# Patient Record
Sex: Female | Born: 1965
Health system: Southern US, Community
[De-identification: ages and names within clinical notes are randomized; demographics above are authoritative.]

## PROBLEM LIST (undated history)

## (undated) DIAGNOSIS — F32A Depression, unspecified: Secondary | ICD-10-CM

## (undated) DIAGNOSIS — K219 Gastro-esophageal reflux disease without esophagitis: Secondary | ICD-10-CM

## (undated) DIAGNOSIS — F329 Major depressive disorder, single episode, unspecified: Secondary | ICD-10-CM

## (undated) DIAGNOSIS — S83207A Unspecified tear of unspecified meniscus, current injury, left knee, initial encounter: Secondary | ICD-10-CM

## (undated) DIAGNOSIS — M1990 Unspecified osteoarthritis, unspecified site: Secondary | ICD-10-CM

## (undated) DIAGNOSIS — F419 Anxiety disorder, unspecified: Secondary | ICD-10-CM

## (undated) HISTORY — PX: BREAST SURGERY: SHX581

## (undated) HISTORY — PX: REDUCTION MAMMAPLASTY: SUR839

## (undated) HISTORY — PX: COLONOSCOPY W/ POLYPECTOMY: SHX1380

## (undated) HISTORY — PX: RHINOPLASTY: SUR1284

## (undated) HISTORY — PX: KNEE SURGERY: SHX244

## (undated) HISTORY — PX: ABDOMINAL HYSTERECTOMY: SHX81

---

## 1997-02-13 HISTORY — PX: RHINOPLASTY: SUR1284

## 1997-11-19 ENCOUNTER — Ambulatory Visit (HOSPITAL_BASED_OUTPATIENT_CLINIC_OR_DEPARTMENT_OTHER): Admission: RE | Admit: 1997-11-19 | Discharge: 1997-11-19 | Payer: Self-pay | Admitting: Otolaryngology

## 1998-02-13 HISTORY — PX: ABDOMINAL HYSTERECTOMY: SHX81

## 2003-02-14 HISTORY — PX: BREAST REDUCTION SURGERY: SHX8

## 2003-11-24 ENCOUNTER — Emergency Department (HOSPITAL_COMMUNITY): Admission: EM | Admit: 2003-11-24 | Discharge: 2003-11-24 | Payer: Self-pay | Admitting: Family Medicine

## 2003-11-25 ENCOUNTER — Emergency Department (HOSPITAL_COMMUNITY): Admission: EM | Admit: 2003-11-25 | Discharge: 2003-11-25 | Payer: Self-pay | Admitting: Family Medicine

## 2004-02-14 HISTORY — PX: REDUCTION MAMMAPLASTY: SUR839

## 2009-04-21 ENCOUNTER — Encounter: Admission: RE | Admit: 2009-04-21 | Discharge: 2009-06-01 | Payer: Self-pay | Admitting: Neurology

## 2012-06-03 ENCOUNTER — Emergency Department (HOSPITAL_COMMUNITY)
Admission: EM | Admit: 2012-06-03 | Discharge: 2012-06-03 | Disposition: A | Discharge status: Home / Self Care | Payer: 59 | Attending: Emergency Medicine | Admitting: Emergency Medicine

## 2012-06-03 ENCOUNTER — Encounter (HOSPITAL_COMMUNITY): Payer: Self-pay | Admitting: Emergency Medicine

## 2012-06-03 ENCOUNTER — Emergency Department (HOSPITAL_COMMUNITY): Payer: 59

## 2012-06-03 DIAGNOSIS — X500XXA Overexertion from strenuous movement or load, initial encounter: Secondary | ICD-10-CM | POA: Insufficient documentation

## 2012-06-03 DIAGNOSIS — Y92009 Unspecified place in unspecified non-institutional (private) residence as the place of occurrence of the external cause: Secondary | ICD-10-CM | POA: Insufficient documentation

## 2012-06-03 DIAGNOSIS — Z87891 Personal history of nicotine dependence: Secondary | ICD-10-CM | POA: Insufficient documentation

## 2012-06-03 DIAGNOSIS — Y9389 Activity, other specified: Secondary | ICD-10-CM | POA: Insufficient documentation

## 2012-06-03 DIAGNOSIS — W010XXA Fall on same level from slipping, tripping and stumbling without subsequent striking against object, initial encounter: Secondary | ICD-10-CM | POA: Insufficient documentation

## 2012-06-03 DIAGNOSIS — IMO0002 Reserved for concepts with insufficient information to code with codable children: Secondary | ICD-10-CM | POA: Insufficient documentation

## 2012-06-03 DIAGNOSIS — M1711 Unilateral primary osteoarthritis, right knee: Secondary | ICD-10-CM

## 2012-06-03 DIAGNOSIS — Z79899 Other long term (current) drug therapy: Secondary | ICD-10-CM | POA: Insufficient documentation

## 2012-06-03 DIAGNOSIS — S8391XA Sprain of unspecified site of right knee, initial encounter: Secondary | ICD-10-CM

## 2012-06-03 DIAGNOSIS — M1710 Unilateral primary osteoarthritis, unspecified knee: Secondary | ICD-10-CM | POA: Insufficient documentation

## 2012-06-03 MED ORDER — OXYCODONE-ACETAMINOPHEN 5-325 MG PO TABS: ORAL_TABLET | ORAL | Status: DC

## 2012-06-03 MED ORDER — KETOROLAC TROMETHAMINE 30 MG/ML IJ SOLN
15.0000 mg | Freq: Once | INTRAMUSCULAR | Status: AC
  Administered 2012-06-03: 15 mg via INTRAVENOUS
  Filled 2012-06-03: qty 1

## 2012-06-03 NOTE — ED Provider Notes (Signed)
History     CSN: [id number]  Arrival date & time 06/03/12  0128   First MD Initiated Contact with Patient 06/03/12 0240      Chief Complaint  Patient presents with  . Fall  . Knee Pain    (Consider location/radiation/quality/duration/timing/severity/associated sxs/prior treatment) HPI Comments: Patient with prior history of degeneration to her back, possible arthritis reports that she had gotten up from bed placed in her feet and stood up and in a twisting motion developed acute pain to the medial side of her right knee. She rested for a while the pain was not getting better, is unable to bend her knee. Patient had a similar episode several days ago, however symptoms improved after about an hour or so and she was able to then her knee, stand and walk. Patient is a Emergency planning/management officer by trade. She reports that she did have cortisone injections to her left knee in the past. She denies any prior significant injury to her right knee, surgery. She denies any distal numbness, tingling, no skin discoloration. Denies any recent back trauma or injury. EMS had to be called, patient was given IV fentanyl en route with some improvement of her pain.  Patient is a 47 y.o. female presenting with fall and knee pain. The history is provided by the patient.  Fall Pertinent negatives include no fever and no numbness.  Knee Pain Associated symptoms: no back pain and no fever     History reviewed. No pertinent past medical history.  Past Surgical History  Procedure Laterality Date  . Abdominal hysterectomy      Full  . Rhinoplasty  1999  . Breast reduction surgery  2005    History reviewed. No pertinent family history.  History  Substance Use Topics  . Smoking status: Former Smoker    Quit date: 06/04/2002  . Smokeless tobacco: Never Used  . Alcohol Use: Yes     Comment: socially, 2x per wk    OB History   Grav Para Term Preterm Abortions TAB SAB Ect Mult Living                  Review of  Systems  Constitutional: Negative for fever and chills.  Musculoskeletal: Positive for joint swelling and arthralgias. Negative for back pain.  Skin: Negative for color change and wound.  Neurological: Negative for weakness and numbness.    Allergies  Codeine and Sulfa antibiotics  Home Medications   Current Outpatient Rx  Name  Route  Sig  Dispense  Refill  . buPROPion (WELLBUTRIN SR) 150 MG 12 hr tablet   Oral   Take 150 mg by mouth 2 (two) times daily.         . calcium carbonate (TUMS - DOSED IN MG ELEMENTAL CALCIUM) 500 MG chewable tablet   Oral   Chew 1 tablet by mouth daily.         Marland Kitchen Dexlansoprazole (DEXILANT) 30 MG capsule   Oral   Take 30 mg by mouth daily.         Marland Kitchen estrogen-methylTESTOSTERone (ESTRATEST) 1.25-2.5 MG per tablet   Oral   Take 1 tablet by mouth daily.         . LamoTRIgine (LAMICTAL PO)   Oral   Take by mouth 2 (two) times daily.         . methocarbamol (ROBAXIN) 500 MG tablet   Oral   Take 500 mg by mouth 4 (four) times daily.         Marland Kitchen  metoCLOPramide (REGLAN) 5 MG tablet   Oral   Take 5 mg by mouth 2 (two) times daily.         . solifenacin (VESICARE) 5 MG tablet   Oral   Take 10 mg by mouth daily.         Marland Kitchen zolpidem (AMBIEN) 10 MG tablet   Oral   Take 10 mg by mouth at bedtime as needed for sleep.         Marland Kitchen oxyCODONE-acetaminophen (PERCOCET/ROXICET) 5-325 MG per tablet      1-2 tablets po q 6 hours prn moderate to severe pain   20 tablet   0     BP 117/63  Pulse 87  Temp(Src) 99 F (37.2 C) (Oral)  Resp 18  SpO2 96%  Physical Exam  Nursing note and vitals reviewed. Constitutional: She appears well-developed and well-nourished. No distress.  Pulmonary/Chest: Effort normal. No respiratory distress.  Musculoskeletal:       Right knee: She exhibits decreased range of motion and abnormal meniscus. She exhibits no swelling, no effusion, no deformity, no laceration, no erythema and normal patellar mobility.  Tenderness found. Medial joint line and MCL tenderness noted. No patellar tendon tenderness noted.       Legs: Neurological: She is alert.  Skin: Skin is warm. She is not diaphoretic.    ED Course  Procedures (including critical care time)  Labs Reviewed - No data to display Dg Knee Complete 4 Views Right  06/03/2012  *RADIOLOGY REPORT*  Clinical Data: Medial knee pain, recent twisting injury.  RIGHT KNEE - COMPLETE 4+ VIEW  Comparison: None.  Findings: Rounded calcification medial to the medial femoral condyle may reflect sequelae of remote injury.  Rounded calcific density projecting just medial to the medial tibial spine projects anterior to the tibia on the lateral view and may reflect a loose body or sequelae of remote trauma.  Mild lateral compartment and moderate medial and patellofemoral compartment degenerative changes.  Tiny joint effusion.  No acute fracture or dislocation.  IMPRESSION: Tricompartmental degenerative changes.  Suggestion of prior trauma however no acute osseous finding identified.  MRI follow-up recommended if concerned for acute internal derangement is present.   Original Report Authenticated By: Jearld Lesch, M.D.      1. Knee sprain and strain, right, initial encounter   2. Arthritis of knee, right     RA sat is 96% and I interpret to be normal.  MDM  Patient with twisting injury to right knee. No obvious effusion. Extreme pain with flexion greater than about 20. Pain is mostly in the medial line. Possibly some slight laxity but not definite and limited by her pain. Plain film shows no acute fracture, does show tricompartmental degeneration. Patient thinks that her previous knee injections were provided by [facility] orthopedics. Plan is to put her in a knee immobilizer, given crutches, refer back to [facility]oped[facility] for close followup. Ice given as well.          Gavin Pound. Agapita Savarino, MD 06/03/12 1610

## 2012-06-03 NOTE — Discharge Instructions (Signed)
 Knee Sprain A knee sprain is a tear in one of the strong, fibrous tissues that connect the bones (ligaments) in your knee. The severity of the sprain depends on how much of the ligament is torn. The tear can be either partial or complete. CAUSES  Often, sprains are a result of a fall or injury. The force of the impact causes the fibers of your ligament to stretch too much. This excess tension causes the fibers of your ligament to tear. SYMPTOMS  You may have some loss of motion in your knee. Other symptoms include:  Bruising.  Tenderness.  Swelling. DIAGNOSIS  In order to diagnose knee sprain, your caregiver will physically examine your knee to determine how torn the ligament is. Your caregiver may also suggest an X-ray exam of your knee to make sure no bones are broken. TREATMENT  If your ligament is only partially torn, treatment usually involves keeping the knee in a fixed position (immobilization) or bracing your knee for activities that require movement for several weeks. To do this, your caregiver will apply a bandage, cast, or splint to keep your knee from moving or support your knee during movement until it heals. For a partially torn ligament, the healing process usually takes 4 to 6 weeks. If your ligament is completely torn, depending on which ligament it is, you may need surgery to reconnect the ligament to the bone or reconstruct it. After surgery, a cast or splint may be applied and will need to stay on your knee for 4 to 6 weeks while your ligament heals. HOME CARE INSTRUCTIONS  Keep your injured knee elevated to decrease swelling.  To ease pain and swelling, apply ice to your knee twice a day, for 2 to 3 days:  Put ice in a plastic bag.  Place a towel between your skin and the bag.  Leave the ice on for 15 minutes.  Only take over-the-counter or prescription medicine for pain as directed by your caregiver.  Do not leave your knee unprotected until pain and stiffness go  away (usually 4 to 6 weeks).  Do not allow your cast or splint to get wet. If you have been instructed not to remove it, cover your cast or splint with a plastic bag when you shower or bathe. Do not swim.  Your caregiver may suggest exercises for you to do during your recovery to prevent or limit permanent weakness and stiffness. SEEK IMMEDIATE MEDICAL CARE IF:  Your cast or splint becomes damaged.  Your pain becomes worse. MAKE SURE YOU:  Understand these instructions.  Will watch your condition.  Will get help right away if you are not doing well or get worse. Document Released: 01/30/2005 Document Revised: 04/24/2011 Document Reviewed: 01/14/2011 [facility] Patient Information 2013 [place], [place].   Narcotic and benzodiazepine use may cause drowsiness, slowed breathing or dependence.  Please use with caution and do not drive, operate machinery or watch young children alone while taking them.  Taking combinations of these medications or drinking alcohol will potentiate these effects.

## 2012-06-03 NOTE — ED Notes (Signed)
Per EMS pt was getting up to go to the restroom and twisted her knee and she fell on it and has her knee has been locked up ever since  Pt has pain upon movement  IV was started and fentanyl 150 mcg was given prior to arrival

## 2012-06-03 NOTE — ED Notes (Signed)
[id number]<AV> Expected date:06/03/12<BR> Expected time: 1:16 AM<BR> Means of arrival:Ambulance<BR> Comments:<BR> Fall/Knee pain

## 2012-06-06 NOTE — Progress Notes (Signed)
Need orders for 4-30 surgery, pre op is 4-28 200 pm thanks

## 2012-06-07 ENCOUNTER — Other Ambulatory Visit: Payer: Self-pay | Admitting: Orthopedic Surgery

## 2012-06-07 ENCOUNTER — Encounter (HOSPITAL_COMMUNITY): Payer: Self-pay | Admitting: Pharmacy Technician

## 2012-06-07 NOTE — Progress Notes (Signed)
Dr Simonne Come-  NEED PRE OP ORDERS PLEASE-  HAS PST APPT 06/10/12 [facility]

## 2012-06-10 ENCOUNTER — Encounter (HOSPITAL_COMMUNITY): Payer: Self-pay

## 2012-06-10 ENCOUNTER — Encounter (HOSPITAL_COMMUNITY)
Admission: RE | Admit: 2012-06-10 | Discharge: 2012-06-10 | Disposition: A | Discharge status: Home / Self Care | Payer: 59 | Source: Ambulatory Visit | Attending: Orthopedic Surgery | Admitting: Orthopedic Surgery

## 2012-06-10 HISTORY — DX: Gastro-esophageal reflux disease without esophagitis: K21.9

## 2012-06-10 HISTORY — DX: Major depressive disorder, single episode, unspecified: F32.9

## 2012-06-10 HISTORY — DX: Unspecified osteoarthritis, unspecified site: M19.90

## 2012-06-10 HISTORY — DX: Depression, unspecified: F32.A

## 2012-06-10 HISTORY — DX: Anxiety disorder, unspecified: F41.9

## 2012-06-10 LAB — CBC
HCT: 42.2 % (ref 36.0–46.0)
Hemoglobin: 14.3 g/dL (ref 12.0–15.0)
MCH: 30.8 pg (ref 26.0–34.0)
MCHC: 33.9 g/dL (ref 30.0–36.0)
MCV: 90.9 fL (ref 78.0–100.0)
Platelets: 291 10*3/uL (ref 150–400)
RBC: 4.64 MIL/uL (ref 3.87–5.11)
RDW: 12.5 % (ref 11.5–15.5)
WBC: 7.6 10*3/uL (ref 4.0–10.5)

## 2012-06-10 LAB — SURGICAL PCR SCREEN
MRSA, PCR: POSITIVE — AB
Staphylococcus aureus: POSITIVE — AB

## 2012-06-10 NOTE — Patient Instructions (Addendum)
20 Ana Carter  06/10/2012   Your procedure is scheduled on:  4-30 -2014  Report to [facility] at      1200 noon.  Call this number if you have problems the morning of surgery: [phone]  Or Presurgical Testing [phone](Crockett Rallo)      Do not eat food:After Midnight.  May have clear liquids:up to 6 Hours before arrival. Nothing after : 0800 am  Clear liquids include soda, tea, black coffee, apple or grape juice, broth.  Take these medicines the morning of surgery with A SIP OF WATER: Oxycodone. Xanax. Wellbutrin. Estratest. Tramadol. Lamictal. Metoclopramide.   Do not wear jewelry, make-up or nail polish.  Do not wear lotions, powders, or perfumes. You may wear deodorant.  Do not shave 12 hours prior to first CHG shower(legs and under arms).(face and neck okay.)  Do not bring valuables to the hospital.  Contacts, dentures or bridgework,body piercing,  may not be worn into surgery.  Leave suitcase in the car. After surgery it may be brought to your room.  For patients admitted to the hospital, checkout time is 11:00 AM the day of discharge.   Patients discharged the day of surgery will not be allowed to drive home. Must have responsible person with you x 24 hours once discharged.  Name and phone number of your driver: April Baker, [phone]  Special Instructions: CHG(Chlorhedine 4%-"Hibiclens","Betasept","Aplicare") Shower Use Special Wash: see special instructions.(avoid face and genitals)   Please read over the following fact sheets that you were given: MRSA Information.    Failure to follow these instructions may result in Cancellation of your surgery.   Patient signature_______________________________________________________

## 2012-06-11 NOTE — Progress Notes (Signed)
PCR screen -Positive for MRSA-pt. To initiate use of Mupirocin Ointment. Aware will be on Contact Isolation.W. Kennon Portela

## 2012-06-11 NOTE — Pre-Procedure Instructions (Addendum)
06-11-12 4098 -message left to inform pt. PCR screen Positive for MRSA- get Mupirocin Ointment and use as directed- asked to return call to confirm. Note faxed to Dr. Simonne Come office to inform of this. Also, pt. Instructed -Contact Isolation would be required during her stay. W. Jalonda Antigua,RN 06-11-12 0930- Pt. Returned call verified to get Rx. For Mupirocin and use as directed.W. Kennon Portela

## 2012-06-12 ENCOUNTER — Encounter (HOSPITAL_COMMUNITY): Payer: Self-pay | Admitting: Anesthesiology

## 2012-06-12 ENCOUNTER — Encounter (HOSPITAL_COMMUNITY): Payer: Self-pay | Admitting: *Deleted

## 2012-06-12 ENCOUNTER — Ambulatory Visit (HOSPITAL_COMMUNITY): Payer: 59 | Admitting: Anesthesiology

## 2012-06-12 ENCOUNTER — Encounter (HOSPITAL_COMMUNITY)
Admission: RE | Disposition: A | Discharge status: Home / Self Care | Payer: Self-pay | Source: Ambulatory Visit | Attending: Orthopedic Surgery

## 2012-06-12 ENCOUNTER — Ambulatory Visit (HOSPITAL_COMMUNITY)
Admission: RE | Admit: 2012-06-12 | Discharge: 2012-06-12 | Disposition: A | Discharge status: Home / Self Care | Payer: 59 | Source: Ambulatory Visit | Attending: Orthopedic Surgery | Admitting: Orthopedic Surgery

## 2012-06-12 DIAGNOSIS — Z882 Allergy status to sulfonamides status: Secondary | ICD-10-CM | POA: Insufficient documentation

## 2012-06-12 DIAGNOSIS — M659 Unspecified synovitis and tenosynovitis, unspecified site: Secondary | ICD-10-CM | POA: Insufficient documentation

## 2012-06-12 DIAGNOSIS — Z87891 Personal history of nicotine dependence: Secondary | ICD-10-CM | POA: Insufficient documentation

## 2012-06-12 DIAGNOSIS — X58XXXA Exposure to other specified factors, initial encounter: Secondary | ICD-10-CM | POA: Insufficient documentation

## 2012-06-12 DIAGNOSIS — F329 Major depressive disorder, single episode, unspecified: Secondary | ICD-10-CM | POA: Insufficient documentation

## 2012-06-12 DIAGNOSIS — Z79899 Other long term (current) drug therapy: Secondary | ICD-10-CM | POA: Insufficient documentation

## 2012-06-12 DIAGNOSIS — F3289 Other specified depressive episodes: Secondary | ICD-10-CM | POA: Insufficient documentation

## 2012-06-12 DIAGNOSIS — Z885 Allergy status to narcotic agent status: Secondary | ICD-10-CM | POA: Insufficient documentation

## 2012-06-12 DIAGNOSIS — K219 Gastro-esophageal reflux disease without esophagitis: Secondary | ICD-10-CM | POA: Insufficient documentation

## 2012-06-12 DIAGNOSIS — Z9889 Other specified postprocedural states: Secondary | ICD-10-CM

## 2012-06-12 DIAGNOSIS — F411 Generalized anxiety disorder: Secondary | ICD-10-CM | POA: Insufficient documentation

## 2012-06-12 DIAGNOSIS — S83289A Other tear of lateral meniscus, current injury, unspecified knee, initial encounter: Secondary | ICD-10-CM | POA: Insufficient documentation

## 2012-06-12 DIAGNOSIS — M1710 Unilateral primary osteoarthritis, unspecified knee: Secondary | ICD-10-CM | POA: Insufficient documentation

## 2012-06-12 DIAGNOSIS — M47817 Spondylosis without myelopathy or radiculopathy, lumbosacral region: Secondary | ICD-10-CM | POA: Insufficient documentation

## 2012-06-12 HISTORY — PX: STERIOD INJECTION: SHX5046

## 2012-06-12 HISTORY — PX: KNEE ARTHROSCOPY WITH MEDIAL MENISECTOMY: SHX5651

## 2012-06-12 SURGERY — ARTHROSCOPY, KNEE, WITH MEDIAL MENISCECTOMY
Anesthesia: General | Site: Knee | Laterality: Right | Wound class: Clean

## 2012-06-12 MED ORDER — LACTATED RINGERS IV SOLN
INTRAVENOUS | Status: DC | PRN
  Administered 2012-06-12: 13:00:00 via INTRAVENOUS

## 2012-06-12 MED ORDER — KETOROLAC TROMETHAMINE 30 MG/ML IJ SOLN
15.0000 mg | Freq: Once | INTRAMUSCULAR | Status: AC | PRN
  Administered 2012-06-12: 30 mg via INTRAVENOUS
  Filled 2012-06-12: qty 1

## 2012-06-12 MED ORDER — MORPHINE SULFATE 4 MG/ML IJ SOLN
INTRAMUSCULAR | Status: DC | PRN
  Administered 2012-06-12: 4 mg via INTRAMUSCULAR

## 2012-06-12 MED ORDER — BUPIVACAINE-EPINEPHRINE 0.5% -1:200000 IJ SOLN
INTRAMUSCULAR | Status: DC | PRN
  Administered 2012-06-12: 2 mL

## 2012-06-12 MED ORDER — PROMETHAZINE HCL 25 MG/ML IJ SOLN: 6.2500 mg | INTRAMUSCULAR | Status: DC | PRN

## 2012-06-12 MED ORDER — PROPOFOL 10 MG/ML IV BOLUS
INTRAVENOUS | Status: DC | PRN
  Administered 2012-06-12: 200 mg via INTRAVENOUS

## 2012-06-12 MED ORDER — SCOPOLAMINE 1 MG/3DAYS TD PT72
MEDICATED_PATCH | TRANSDERMAL | Status: DC | PRN
  Administered 2012-06-12: 1 via TRANSDERMAL

## 2012-06-12 MED ORDER — METHYLPREDNISOLONE ACETATE 80 MG/ML IJ SUSP
INTRAMUSCULAR | Status: DC | PRN
  Administered 2012-06-12: 80 mg via INTRAMUSCULAR

## 2012-06-12 MED ORDER — LACTATED RINGERS IR SOLN
Status: DC | PRN
  Administered 2012-06-12: 6000 mL

## 2012-06-12 MED ORDER — FENTANYL CITRATE 0.05 MG/ML IJ SOLN
25.0000 ug | INTRAMUSCULAR | Status: DC | PRN
  Administered 2012-06-12 (×2): 50 ug via INTRAVENOUS

## 2012-06-12 MED ORDER — METHYLPREDNISOLONE ACETATE 40 MG/ML IJ SUSP
INTRAMUSCULAR | Status: AC
  Filled 2012-06-12: qty 2

## 2012-06-12 MED ORDER — ONDANSETRON HCL 4 MG/2ML IJ SOLN
INTRAMUSCULAR | Status: DC | PRN
  Administered 2012-06-12: 4 mg via INTRAVENOUS

## 2012-06-12 MED ORDER — MORPHINE SULFATE 4 MG/ML IJ SOLN
INTRAMUSCULAR | Status: AC
  Filled 2012-06-12: qty 1

## 2012-06-12 MED ORDER — FENTANYL CITRATE 0.05 MG/ML IJ SOLN
INTRAMUSCULAR | Status: AC
  Filled 2012-06-12: qty 2

## 2012-06-12 MED ORDER — ACETAMINOPHEN 10 MG/ML IV SOLN
INTRAVENOUS | Status: AC
  Filled 2012-06-12: qty 100

## 2012-06-12 MED ORDER — LACTATED RINGERS IV SOLN
INTRAVENOUS | Status: DC
  Administered 2012-06-12: 16:00:00 via INTRAVENOUS

## 2012-06-12 MED ORDER — SCOPOLAMINE 1 MG/3DAYS TD PT72
MEDICATED_PATCH | TRANSDERMAL | Status: AC
  Filled 2012-06-12: qty 1

## 2012-06-12 MED ORDER — BUPIVACAINE-EPINEPHRINE 0.5% -1:200000 IJ SOLN
INTRAMUSCULAR | Status: DC | PRN
  Administered 2012-06-12: 20 mL

## 2012-06-12 MED ORDER — DEXAMETHASONE SODIUM PHOSPHATE 10 MG/ML IJ SOLN
INTRAMUSCULAR | Status: DC | PRN
  Administered 2012-06-12: 5 mg via INTRAVENOUS

## 2012-06-12 MED ORDER — LIDOCAINE HCL (PF) 2 % IJ SOLN
INTRAMUSCULAR | Status: DC | PRN
  Administered 2012-06-12: 75 mg

## 2012-06-12 MED ORDER — FENTANYL CITRATE 0.05 MG/ML IJ SOLN
INTRAMUSCULAR | Status: DC | PRN
  Administered 2012-06-12 (×2): 50 ug via INTRAVENOUS
  Administered 2012-06-12: 100 ug via INTRAVENOUS
  Administered 2012-06-12: 50 ug via INTRAVENOUS

## 2012-06-12 MED ORDER — ACETAMINOPHEN 10 MG/ML IV SOLN
INTRAVENOUS | Status: DC | PRN
  Administered 2012-06-12: 1000 mg via INTRAVENOUS

## 2012-06-12 MED ORDER — MEPERIDINE HCL 50 MG PO TABS: 50.0000 mg | ORAL_TABLET | ORAL | Status: DC | PRN

## 2012-06-12 MED ORDER — MIDAZOLAM HCL 5 MG/5ML IJ SOLN
INTRAMUSCULAR | Status: DC | PRN
  Administered 2012-06-12: 2 mg via INTRAVENOUS

## 2012-06-12 MED ORDER — BUPIVACAINE-EPINEPHRINE (PF) 0.5% -1:200000 IJ SOLN
INTRAMUSCULAR | Status: AC
  Filled 2012-06-12: qty 10

## 2012-06-12 MED ORDER — POVIDONE-IODINE 7.5 % EX SOLN: Freq: Once | CUTANEOUS | Status: DC

## 2012-06-12 SURGICAL SUPPLY — 31 items
BANDAGE ELASTIC 6 VELCRO ST LF (GAUZE/BANDAGES/DRESSINGS) ×1 IMPLANT
BANDAGE GAUZE ELAST BULKY 4 IN (GAUZE/BANDAGES/DRESSINGS) ×1 IMPLANT
BLADE 4.2CUDA (BLADE) IMPLANT
BLADE CUDA SHAVER 3.5 (BLADE) ×3 IMPLANT
CLOTH BEACON ORANGE TIMEOUT ST (SAFETY) ×3 IMPLANT
CUFF TOURN SGL QUICK 34 (TOURNIQUET CUFF) ×3
CUFF TRNQT CYL 34X4X40X1 (TOURNIQUET CUFF) ×2 IMPLANT
DRSG EMULSION OIL 3X3 NADH (GAUZE/BANDAGES/DRESSINGS) ×3 IMPLANT
DRSG PAD ABDOMINAL 8X10 ST (GAUZE/BANDAGES/DRESSINGS) ×2 IMPLANT
DURAPREP 26ML APPLICATOR (WOUND CARE) ×3 IMPLANT
ELECT REM PT RETURN 9FT ADLT (ELECTROSURGICAL)
ELECTRODE REM PT RTRN 9FT ADLT (ELECTROSURGICAL) ×2 IMPLANT
GLOVE BIO SURGEON STRL SZ7.5 (GLOVE) ×3 IMPLANT
GLOVE BIO SURGEON STRL SZ8 (GLOVE) ×6 IMPLANT
GLOVE ECLIPSE 8.0 STRL XLNG CF (GLOVE) ×9 IMPLANT
GLOVE INDICATOR 8.0 STRL GRN (GLOVE) ×6 IMPLANT
MANIFOLD NEPTUNE II (INSTRUMENTS) ×5 IMPLANT
NDL SAFETY ECLIPSE 18X1.5 (NEEDLE) IMPLANT
NEEDLE HYPO 18GX1.5 SHARP (NEEDLE) ×3
NEEDLE HYPO 22GX1.5 SAFETY (NEEDLE) ×1 IMPLANT
PACK ARTHROSCOPY WL (CUSTOM PROCEDURE TRAY) ×3 IMPLANT
PAD CAST 4YDX4 CTTN HI CHSV (CAST SUPPLIES) ×2 IMPLANT
PADDING CAST COTTON 4X4 STRL (CAST SUPPLIES) ×3
POSITIONER SURGICAL ARM (MISCELLANEOUS) ×2 IMPLANT
SET ARTHROSCOPY TUBING (MISCELLANEOUS) ×3
SET ARTHROSCOPY TUBING LN (MISCELLANEOUS) ×2 IMPLANT
SPONGE GAUZE 4X4 12PLY (GAUZE/BANDAGES/DRESSINGS) ×1 IMPLANT
SUT ETHILON 4 0 PS 2 18 (SUTURE) ×3 IMPLANT
SYR 5ML LL (SYRINGE) ×2 IMPLANT
WAND 90 DEG TURBOVAC W/CORD (SURGICAL WAND) ×2 IMPLANT
WRAP KNEE MAXI GEL POST OP (GAUZE/BANDAGES/DRESSINGS) ×3 IMPLANT

## 2012-06-12 NOTE — H&P (Signed)
Ana Carter is an 47 y.o. female.   Chief Complaint:bilat knee pain with locking rt HPI: MRI rt demonstrates loose bodies  Past Medical History  Diagnosis Date  . Anxiety   . Depression   . GERD (gastroesophageal reflux disease)   . Arthritis     knees and lower back -arthritis    Past Surgical History  Procedure Laterality Date  . Abdominal hysterectomy      Full  . Rhinoplasty  1999  . Breast reduction surgery  2005    also Liposuction of abdomen  . Lasik      bilateral  . Colonoscopy w/ polypectomy      History reviewed. No pertinent family history. Social History:  reports that she quit smoking about 10 years ago. She has never used smokeless tobacco. She reports that  drinks alcohol. She reports that she does not use illicit drugs.  Allergies:  Allergies  Allergen Reactions  . Codeine Nausea And Vomiting  . Sulfa Antibiotics     unknown    Medications Prior to Admission  Medication Sig Dispense Refill  . ALPRAZolam (XANAX) 0.5 MG tablet Take 0.5 mg by mouth daily as needed for sleep or anxiety.      Marland Kitchen buPROPion (WELLBUTRIN XL) 150 MG 24 hr tablet Take 150 mg by mouth 2 (two) times daily.      Marland Kitchen dexlansoprazole (DEXILANT) 60 MG capsule Take 60 mg by mouth daily.      Marland Kitchen estrogen-methylTESTOSTERone (ESTRATEST) 1.25-2.5 MG per tablet Take 1 tablet by mouth daily.      Marland Kitchen lamoTRIgine (LAMICTAL) 200 MG tablet Take 200 mg by mouth 2 (two) times daily.      Marland Kitchen oxybutynin (DITROPAN) 5 MG tablet Take 5 mg by mouth at bedtime.      . polyvinyl alcohol (LIQUIFILM TEARS) 1.4 % ophthalmic solution Place 1 drop into both eyes daily as needed (for dry eyes).      . traMADol (ULTRAM) 50 MG tablet Take 50 mg by mouth every 6 (six) hours as needed for pain.      Marland Kitchen zolpidem (AMBIEN) 10 MG tablet Take 10 mg by mouth at bedtime as needed for sleep.      Marland Kitchen diclofenac sodium (VOLTAREN) 1 % GEL Apply 2 g topically 2 (two) times daily as needed (for pain). Apply to inner part of knee       . fish oil-omega-3 fatty acids 1000 MG capsule Take 1 g by mouth daily.      Marland Kitchen ketorolac (TORADOL) 10 MG tablet Take 10 mg by mouth every 6 (six) hours as needed for pain.      Marland Kitchen metoCLOPramide (REGLAN) 10 MG tablet Take 10 mg by mouth 2 (two) times daily.      Marland Kitchen oxyCODONE-acetaminophen (PERCOCET/ROXICET) 5-325 MG per tablet 1-2 tablets po q 6 hours prn moderate to severe pain  20 tablet  0  . sucralfate (CARAFATE) 1 G tablet Take 0.5 g by mouth 2 (two) times daily.      . vitamin B-12 (CYANOCOBALAMIN) 1000 MCG tablet Take 1,000 mcg by mouth daily.        Results for orders placed during the hospital encounter of 06/10/12 (from the past 48 hour(s))  SURGICAL PCR SCREEN     Status: Abnormal   Collection Time    06/10/12  3:03 PM      Result Value Range   MRSA, PCR POSITIVE (*) NEGATIVE   Staphylococcus aureus POSITIVE (*) NEGATIVE   Comment:  The Xpert SA Assay (FDA     approved for NASAL specimens     in patients over 46 years of age),     is one component of     a comprehensive surveillance     program.  Test performance has     been validated by [employer] for patients greater     than or equal to 56 year old.     It is not intended     to diagnose infection nor to     guide or monitor treatment.  CBC     Status: None   Collection Time    06/10/12  3:25 PM      Result Value Range   WBC 7.6  4.0 - 10.5 K/uL   RBC 4.64  3.87 - 5.11 MIL/uL   Hemoglobin 14.3  12.0 - 15.0 g/dL   HCT 84.6  96.2 - 95.2 %   MCV 90.9  78.0 - 100.0 fL   MCH 30.8  26.0 - 34.0 pg   MCHC 33.9  30.0 - 36.0 g/dL   RDW 84.1  32.4 - 40.1 %   Platelets 291  150 - 400 K/uL   No results found.  ROS  Blood pressure 125/70, pulse 74, temperature 98.8 F (37.1 C), temperature source Oral, resp. rate 16, SpO2 100.00%. Physical Exam  Constitutional: She is oriented to person, place, and time. She appears well-developed and well-nourished.  HENT:  Head: Normocephalic and atraumatic.   Right Ear: External ear normal.  Left Ear: External ear normal.  Nose: Nose normal.  Mouth/Throat: Oropharynx is clear and moist.  Eyes: Conjunctivae and EOM are normal. Pupils are equal, round, and reactive to light.  Neck: Normal range of motion. Neck supple.  Cardiovascular: Normal rate, regular rhythm, normal heart sounds and intact distal pulses.   Respiratory: Effort normal and breath sounds normal.  GI: Soft. Bowel sounds are normal.  Musculoskeletal:  Right knee--painful rom and tenderness mainly medial;also some pain in lt knee  Neurological: She is alert and oriented to person, place, and time. She has normal reflexes.  Skin: Skin is warm and dry.  Psychiatric: She has a normal mood and affect. Her behavior is normal. Judgment and thought content normal.     Assessment/Plan Pain and locking rt knee Rt knee arthroscopy with removal loose bodies;steroid injection lt knee  Hendrix Yurkovich P 06/12/2012, 2:08 PM

## 2012-06-12 NOTE — Anesthesia Postprocedure Evaluation (Signed)
  Anesthesia Post-op Note  Patient: Ana Carter  Procedure(s) Performed: Procedure(s) (LRB): RIGHT KNEE ARTHROSCOPY WITH PARTIAL MEDIAL MENISCECTOMY And chondroplasty (Right) CORTISONE INJECTION TO LEFT KNEE (Left)  Patient Location: PACU  Anesthesia Type: General  Level of Consciousness: awake and alert   Airway and Oxygen Therapy: Patient Spontanous Breathing  Post-op Pain: mild  Post-op Assessment: Post-op Vital signs reviewed, Patient's Cardiovascular Status Stable, Respiratory Function Stable, Patent Airway and No signs of Nausea or vomiting  Last Vitals:  Filed Vitals:   06/12/12 1555  BP:   Pulse: 77  Temp:   Resp: 12    Post-op Vital Signs: stable   Complications: No apparent anesthesia complications

## 2012-06-12 NOTE — Anesthesia Preprocedure Evaluation (Signed)
Anesthesia Evaluation  Patient identified by MRN, date of birth, ID band Patient awake    Reviewed: Allergy & Precautions, H&P , NPO status , Patient's Chart, lab work & pertinent test results  Airway Mallampati: II TM Distance: >3 FB Neck ROM: Full    Dental no notable dental hx.    Pulmonary neg pulmonary ROS,  breath sounds clear to auscultation  Pulmonary exam normal       Cardiovascular negative cardio ROS  Rhythm:Regular Rate:Normal     Neuro/Psych Anxiety negative neurological ROS     GI/Hepatic Neg liver ROS, GERD-  Medicated,  Endo/Other  negative endocrine ROS  Renal/GU negative Renal ROS  negative genitourinary   Musculoskeletal negative musculoskeletal ROS (+)   Abdominal   Peds negative pediatric ROS (+)  Hematology negative hematology ROS (+)   Anesthesia Other Findings   Reproductive/Obstetrics negative OB ROS                           Anesthesia Physical Anesthesia Plan  ASA: II  Anesthesia Plan: General   Post-op Pain Management:    Induction: Intravenous  Airway Management Planned: LMA  Additional Equipment:   Intra-op Plan:   Post-operative Plan:   Informed Consent: I have reviewed the patients History and Physical, chart, labs and discussed the procedure including the risks, benefits and alternatives for the proposed anesthesia with the patient or authorized representative who has indicated his/her understanding and acceptance.   Dental advisory given  Plan Discussed with: CRNA and Surgeon  Anesthesia Plan Comments:         Anesthesia Quick Evaluation

## 2012-06-12 NOTE — Transfer of Care (Signed)
Immediate Anesthesia Transfer of Care Note  Patient: Ana Carter  Procedure(s) Performed: Procedure(s): RIGHT KNEE ARTHROSCOPY WITH PARTIAL MEDIAL MENISCECTOMY And chondroplasty (Right) CORTISONE INJECTION TO LEFT KNEE (Left)  Patient Location: PACU  Anesthesia Type:General  Level of Consciousness: awake, alert , oriented and patient cooperative  Airway & Oxygen Therapy: Patient Spontanous Breathing and Patient connected to face mask oxygen  Post-op Assessment: Report given to PACU RN and Post -op Vital signs reviewed and stable  Post vital signs: Reviewed and stable  Complications: No apparent anesthesia complications

## 2012-06-12 NOTE — Preoperative (Signed)
Beta Blockers   Reason not to administer Beta Blockers:Not Applicable, not on home BB 

## 2012-06-13 ENCOUNTER — Encounter (HOSPITAL_COMMUNITY): Payer: Self-pay | Admitting: Orthopedic Surgery

## 2012-06-13 NOTE — Op Note (Addendum)
NAMEELYANAH, FARINO NO.:  [id number]  MEDICAL RECORD NO.:  [id number]  LOCATION:  [facility]                         FACILITY:  [facility]  PHYSICIAN:  Marlowe Kays, M.D.  DATE OF BIRTH:  [birth year]  DATE OF PROCEDURE:  06/12/2012 DATE OF DISCHARGE:  06/12/2012                              OPERATIVE REPORT   PREOPERATIVE DIAGNOSES: 1. Painful right knee with locking. 2. MRI evidence of loose fragments in the knee.  POSTOPERATIVE DIAGNOSES: 1. Painful right knee with locking. 2. Torn anterior horn of medial meniscus. 3. Painful left knee.  OPERATIONS: 1. Right knee arthroscopy with partial medial meniscectomy,     debridement of the medial femoral condyle, and removal of suspected     loose bodies. 2. Intra-articular injection, left knee, with steroid and Marcaine.  SURGEON:  Marlowe Kays, MD  ASSISTANT:  Nurse.  ANESTHESIA:  General.  PATHOLOGY AND JUSTIFICATION FOR PROCEDURE:  Roughly a week ago, she developed painful right knee and several days later, the knee began to lock on her.  She had come down on the knee, but there was no definite injury.  I saw her in the office.  She was in severe pain with the knee locked.  We sent for an MRI which demonstrated damage to the medial femoral condyle and also what appeared to be some loose bodies in the knee, but no definite meniscus tear.  She also was having some pain in the left knee and since she was here for arthroscopy at this time of the right knee, requested intraarticular steroid injection in her left knee as well.  DESCRIPTION OF PROCEDURE:  Satisfactory general anesthesia, Ace wrap, and knee support to left lower extremity, the right lower extremity tourniquet was applied with leg Esmarch out non-sterilely.  Thigh stabilizer applied.  Right leg was prepped with DuraPrep, stabilizer to ankle, and draped in a sterile field.  Time-out performed.  First, an anterolateral portal and the medial  compartment of the knee joint was evaluated.  On entering the joint, she had blood in the joint with synovitis anteromedially and deformity of the medial meniscus.  I shaved the meniscus down with 3.5 shaver and working posteriorly, I found significant abrasion type of injury to the articular cartilage which I shaved down to smooth with a 3.5 shaver and also smoothed down with a rasp.  Looking posteriorly, there is no definite meniscus tear.  I did not see any definite fragments.  It was however oriented vertically from front to back beneath the area of the wear on the medial femoral condyle _indicating__that______    something had been interposed there, perhaps  __a portion________ of the medial femoral condyle which had been injured and had been interposed there.  Her ACL was intact.  Looking at the medial gutter and suprapatellar area, no abnormalities were noted.  On reversing portals, lateral joint looked relatively normal with minimal wear on the lateral tibial plateau.  Then, irrigated the knee joint until clear and fluid was clear and the irrigant was clear at the time of closure.  I closed the 2 entry portals with 4-0 nylon and then injected 20 mL of 0.5% Marcaine with adrenaline and 4 mg  morphine through the inflow apparatus which I closed with 4-0 nylon as well. While we also prepped the left knee and through the transpatellar tendon approach, I injected 80 mg of Depo Medrol and 3 mL of 0.5% plain Marcaine.  She tolerated the procedure well and was taken to the recovery room in satisfied condition with no known complications.          ______________________________ Marlowe Kays, M.D.     JA/MEDQ  D:  06/12/2012  T:  06/13/2012  Job:  [id number]

## 2012-08-09 ENCOUNTER — Encounter (HOSPITAL_BASED_OUTPATIENT_CLINIC_OR_DEPARTMENT_OTHER): Payer: Self-pay | Admitting: *Deleted

## 2012-08-14 ENCOUNTER — Encounter (HOSPITAL_BASED_OUTPATIENT_CLINIC_OR_DEPARTMENT_OTHER): Payer: Self-pay | Admitting: *Deleted

## 2012-08-14 NOTE — Progress Notes (Signed)
NPO AFTER MN. ARRIVES AT 1030. NEEDS HG. WILL TAKE REGLAN, WELLBUTRIN, LAMICTAL, AND XANAX AN OF SURG W/ SIPS OF WATER.

## 2012-08-18 NOTE — H&P (Signed)
Ana Carter is an 47 y.o. female.   Chief Complaint: left knee pain HPI: The patient is a 47 year old female who presents with knee complaints. The patient reports left knee symptoms including: pain, swelling and locking which began years ago without any known injury. The patient describes the severity of the symptoms as mild.The patient feels that the symptoms are worsening. Past treatment for this problem has included nonsteroidal anti-inflammatory drugs. Symptoms are reported to be located in the left lateral knee and left medial knee and include medial knee tenderness and lateral knee tenderness. The patient describes their pain as aching and stinging. Onset of symptoms was gradual. Current treatment includes nonsteroidal anti-inflammatory drugs. She has had cortisone injections in the past which helped some. This knee has gotten worse since having her rt. knee scope. she has a recent MRI that was ordered by Dr. Simonne Come. She has a partial tear of the medial meniscus. She has rather significant medial compartment arthritis as well.  Past Medical History  Diagnosis Date  . Anxiety   . Depression   . GERD (gastroesophageal reflux disease)   . Acute meniscal tear of left knee   . Arthritis     knees and lower back    Past Surgical History  Procedure Laterality Date  . Rhinoplasty  1999  . Breast reduction surgery  2005    and Liposuction of abdomen  . Colonoscopy w/ polypectomy    . Knee arthroscopy with medial menisectomy Right 06/12/2012    Procedure: RIGHT KNEE ARTHROSCOPY WITH PARTIAL MEDIAL MENISCECTOMY And chondroplasty;  Surgeon: Drucilla Schmidt, MD;  Location: [facility];  Service: Orthopedics;  Laterality: Right;  . Steriod injection Left 06/12/2012    Procedure: CORTISONE INJECTION TO LEFT KNEE;  Surgeon: Drucilla Schmidt, MD;  Location: [facility];  Service: Orthopedics;  Laterality: Left;  . Abdominal hysterectomy  2000    and unilateral salpingoophorectomy/   oher ovary  removed in 2001    History reviewed. No pertinent family history. Social History:  reports that she quit smoking about 10 years ago. She has never used smokeless tobacco. She reports that  drinks alcohol. She reports that she does not use illicit drugs.  Allergies:  Allergies  Allergen Reactions  . Codeine Nausea And Vomiting  . Sulfa Antibiotics     unknown     Current outpatient prescriptions: ALPRAZolam (XANAX) 0.5 MG tablet, Take 0.5 mg by mouth 3 (three) times daily as needed for sleep or anxiety. , Disp: , Rfl: ;  buPROPion (WELLBUTRIN XL) 150 MG 24 hr tablet, Take 150 mg by mouth 2 (two) times daily., Disp: , Rfl: ;  dexlansoprazole (DEXILANT) 60 MG capsule, Take 60 mg by mouth every morning. , Disp: , Rfl:  diclofenac sodium (VOLTAREN) 1 % GEL, Apply 2 g topically 2 (two) times daily as needed (for pain). Apply to inner part of knee, Disp: , Rfl: ;  estrogen-methylTESTOSTERone (ESTRATEST) 1.25-2.5 MG per tablet, Take 1 tablet by mouth daily., Disp: , Rfl: ;  fish oil-omega-3 fatty acids 1000 MG capsule, Take 1 g by mouth daily., Disp: , Rfl: ;  lamoTRIgine (LAMICTAL) 200 MG tablet, Take 200 mg by mouth 2 (two) times daily., Disp: , Rfl:  meperidine (DEMEROL) 50 MG tablet, Take 50 mg by mouth every 4 (four) hours as needed for pain., Disp: , Rfl: ;  metoCLOPramide (REGLAN) 10 MG tablet, Take 5 mg by mouth 2 (two) times daily., Disp: , Rfl: ;  polyvinyl alcohol (LIQUIFILM  TEARS) 1.4 % ophthalmic solution, Place 1 drop into both eyes daily as needed (for dry eyes)., Disp: , Rfl: ;  sucralfate (CARAFATE) 1 G tablet, Take 0.5 g by mouth 2 (two) times daily., Disp: , Rfl:  vitamin B-12 (CYANOCOBALAMIN) 1000 MCG tablet, Take 1,000 mcg by mouth daily., Disp: , Rfl: ;  zolpidem (AMBIEN) 10 MG tablet, Take 10 mg by mouth at bedtime as needed for sleep., Disp: , Rfl:   Review of Systems  Constitutional: Negative.   HENT: Negative.  Negative for neck pain.   Eyes: Negative.   Respiratory:  Negative.   Cardiovascular: Negative.   Gastrointestinal: Positive for heartburn. Negative for nausea, vomiting, abdominal pain, diarrhea, constipation, blood in stool and melena.  Genitourinary: Negative.   Musculoskeletal: Positive for back pain and joint pain. Negative for myalgias and falls.  Skin: Negative.   Neurological: Negative.   Endo/Heme/Allergies: Negative.   Psychiatric/Behavioral: Positive for depression. Negative for suicidal ideas, hallucinations, memory loss and substance abuse. The patient is nervous/anxious. The patient does not have insomnia.     Height 5\' 6"  (1.676 m), weight 78.472 kg (173 lb). Physical Exam  Constitutional: She is oriented to person, place, and time. She appears well-developed and well-nourished. No distress.  HENT:  Head: Normocephalic and atraumatic.  Right Ear: External ear normal.  Left Ear: External ear normal.  Nose: Nose normal.  Mouth/Throat: Oropharynx is clear and moist.  Eyes: Conjunctivae and EOM are normal.  Neck: Normal range of motion. Neck supple. No tracheal deviation present. No thyromegaly present.  Cardiovascular: Normal rate, regular rhythm, normal heart sounds and intact distal pulses.   No murmur heard. Respiratory: Effort normal and breath sounds normal. No respiratory distress. She has no wheezes. She exhibits no tenderness.  GI: Soft. Bowel sounds are normal. She exhibits no distension and no mass. There is no tenderness.  Musculoskeletal:       Right hip: Normal.       Left hip: Normal.       Right knee: Normal.       Left knee: She exhibits decreased range of motion, swelling and abnormal meniscus. She exhibits no effusion and no erythema. Tenderness found. Medial joint line tenderness noted. No lateral joint line tenderness noted.       Right lower leg: She exhibits no tenderness and no swelling.       Left lower leg: She exhibits no tenderness and no swelling.  Lymphadenopathy:    She has no cervical adenopathy.   Neurological: She is alert and oriented to person, place, and time. She has normal strength and normal reflexes. No sensory deficit.  Skin: No rash noted. She is not diaphoretic. No erythema.  Psychiatric: She has a normal mood and affect. Her behavior is normal.     Assessment/Plan Left knee, medial meniscus tear She needs a left knee arthroscopy with medial menisectomy. This will be an outpatient procedure. Dr. Darrelyn Hillock discussed the risks and benefits of the procedure with the patient.    Koichi Platte LAUREN 08/18/2012, 11:35 AM

## 2012-08-20 ENCOUNTER — Ambulatory Visit (HOSPITAL_BASED_OUTPATIENT_CLINIC_OR_DEPARTMENT_OTHER): Payer: 59 | Admitting: Anesthesiology

## 2012-08-20 ENCOUNTER — Ambulatory Visit (HOSPITAL_BASED_OUTPATIENT_CLINIC_OR_DEPARTMENT_OTHER)
Admission: RE | Admit: 2012-08-20 | Discharge: 2012-08-20 | Disposition: A | Discharge status: Home / Self Care | Payer: 59 | Source: Ambulatory Visit | Attending: Orthopedic Surgery | Admitting: Orthopedic Surgery

## 2012-08-20 ENCOUNTER — Encounter (HOSPITAL_BASED_OUTPATIENT_CLINIC_OR_DEPARTMENT_OTHER): Payer: Self-pay | Admitting: Anesthesiology

## 2012-08-20 ENCOUNTER — Encounter (HOSPITAL_BASED_OUTPATIENT_CLINIC_OR_DEPARTMENT_OTHER)
Admission: RE | Disposition: A | Discharge status: Home / Self Care | Payer: Self-pay | Source: Ambulatory Visit | Attending: Orthopedic Surgery

## 2012-08-20 ENCOUNTER — Encounter (HOSPITAL_BASED_OUTPATIENT_CLINIC_OR_DEPARTMENT_OTHER): Payer: Self-pay | Admitting: *Deleted

## 2012-08-20 DIAGNOSIS — Z79899 Other long term (current) drug therapy: Secondary | ICD-10-CM | POA: Insufficient documentation

## 2012-08-20 DIAGNOSIS — M1712 Unilateral primary osteoarthritis, left knee: Secondary | ICD-10-CM

## 2012-08-20 DIAGNOSIS — M1710 Unilateral primary osteoarthritis, unspecified knee: Secondary | ICD-10-CM | POA: Insufficient documentation

## 2012-08-20 DIAGNOSIS — K219 Gastro-esophageal reflux disease without esophagitis: Secondary | ICD-10-CM | POA: Insufficient documentation

## 2012-08-20 HISTORY — DX: Unspecified tear of unspecified meniscus, current injury, left knee, initial encounter: S83.207A

## 2012-08-20 HISTORY — PX: KNEE ARTHROSCOPY WITH MEDIAL MENISECTOMY: SHX5651

## 2012-08-20 SURGERY — ARTHROSCOPY, KNEE, WITH MEDIAL MENISCECTOMY
Anesthesia: General | Site: Knee | Laterality: Left | Wound class: Clean

## 2012-08-20 MED ORDER — MIDAZOLAM HCL 2 MG/2ML IJ SOLN
2.0000 mg | Freq: Once | INTRAMUSCULAR | Status: AC
  Administered 2012-08-20: 2 mg via INTRAVENOUS
  Filled 2012-08-20: qty 2

## 2012-08-20 MED ORDER — SCOPOLAMINE 1 MG/3DAYS TD PT72
MEDICATED_PATCH | TRANSDERMAL | Status: DC | PRN
  Administered 2012-08-20: 1 via TRANSDERMAL

## 2012-08-20 MED ORDER — FENTANYL CITRATE 0.05 MG/ML IJ SOLN
INTRAMUSCULAR | Status: DC | PRN
  Administered 2012-08-20: 25 ug via INTRAVENOUS
  Administered 2012-08-20: 50 ug via INTRAVENOUS
  Administered 2012-08-20: 25 ug via INTRAVENOUS
  Administered 2012-08-20: 50 ug via INTRAVENOUS
  Administered 2012-08-20 (×2): 25 ug via INTRAVENOUS

## 2012-08-20 MED ORDER — BACITRACIN-NEOMYCIN-POLYMYXIN 400-5-5000 EX OINT
TOPICAL_OINTMENT | CUTANEOUS | Status: DC | PRN
  Administered 2012-08-20: 1 via TOPICAL

## 2012-08-20 MED ORDER — OXYCODONE-ACETAMINOPHEN 10-325 MG PO TABS: 1.0000 | ORAL_TABLET | ORAL | Status: DC | PRN

## 2012-08-20 MED ORDER — BUPIVACAINE-EPINEPHRINE 0.25% -1:200000 IJ SOLN
INTRAMUSCULAR | Status: DC | PRN
  Administered 2012-08-20: 30 mL

## 2012-08-20 MED ORDER — CHLORHEXIDINE GLUCONATE 4 % EX LIQD
60.0000 mL | Freq: Once | CUTANEOUS | Status: DC
  Filled 2012-08-20: qty 60

## 2012-08-20 MED ORDER — DIPHENHYDRAMINE HCL 50 MG/ML IJ SOLN
12.5000 mg | INTRAMUSCULAR | Status: AC | PRN
  Administered 2012-08-20 (×2): 12.5 mg via INTRAVENOUS
  Filled 2012-08-20: qty 0.25

## 2012-08-20 MED ORDER — LACTATED RINGERS IV SOLN
INTRAVENOUS | Status: DC
  Administered 2012-08-20 (×2): via INTRAVENOUS
  Filled 2012-08-20: qty 1000

## 2012-08-20 MED ORDER — PROMETHAZINE HCL 25 MG/ML IJ SOLN
6.2500 mg | INTRAMUSCULAR | Status: DC | PRN
  Filled 2012-08-20: qty 1

## 2012-08-20 MED ORDER — PROPOFOL 10 MG/ML IV BOLUS
INTRAVENOUS | Status: DC | PRN
  Administered 2012-08-20: 200 mg via INTRAVENOUS

## 2012-08-20 MED ORDER — LACTATED RINGERS IV SOLN
INTRAVENOUS | Status: DC
  Filled 2012-08-20: qty 1000

## 2012-08-20 MED ORDER — CEFAZOLIN SODIUM-DEXTROSE 2-3 GM-% IV SOLR
2.0000 g | INTRAVENOUS | Status: AC
  Administered 2012-08-20: 2 g via INTRAVENOUS
  Filled 2012-08-20: qty 50

## 2012-08-20 MED ORDER — LIDOCAINE HCL (CARDIAC) 20 MG/ML IV SOLN
INTRAVENOUS | Status: DC | PRN
  Administered 2012-08-20: 80 mg via INTRAVENOUS

## 2012-08-20 MED ORDER — MIDAZOLAM HCL 5 MG/5ML IJ SOLN
INTRAMUSCULAR | Status: DC | PRN
  Administered 2012-08-20: 2 mg via INTRAVENOUS

## 2012-08-20 MED ORDER — SODIUM CHLORIDE 0.9 % IR SOLN
Status: DC | PRN
  Administered 2012-08-20: 9000 mL

## 2012-08-20 MED ORDER — DEXAMETHASONE SODIUM PHOSPHATE 4 MG/ML IJ SOLN
INTRAMUSCULAR | Status: DC | PRN
  Administered 2012-08-20: 10 mg via INTRAVENOUS

## 2012-08-20 MED ORDER — KETOROLAC TROMETHAMINE 30 MG/ML IJ SOLN
INTRAMUSCULAR | Status: DC | PRN
  Administered 2012-08-20: 30 mg via INTRAVENOUS

## 2012-08-20 MED ORDER — FENTANYL CITRATE 0.05 MG/ML IJ SOLN
25.0000 ug | INTRAMUSCULAR | Status: DC | PRN
  Administered 2012-08-20 (×2): 25 ug via INTRAVENOUS
  Filled 2012-08-20: qty 1

## 2012-08-20 MED ORDER — ONDANSETRON HCL 4 MG/2ML IJ SOLN
INTRAMUSCULAR | Status: DC | PRN
  Administered 2012-08-20: 4 mg via INTRAVENOUS

## 2012-08-20 MED ORDER — MEPERIDINE HCL 25 MG/ML IJ SOLN
6.2500 mg | INTRAMUSCULAR | Status: DC | PRN
  Filled 2012-08-20: qty 1

## 2012-08-20 SURGICAL SUPPLY — 42 items
BANDAGE ELASTIC 4 VELCRO ST LF (GAUZE/BANDAGES/DRESSINGS) ×2 IMPLANT
BANDAGE ELASTIC 6 VELCRO ST LF (GAUZE/BANDAGES/DRESSINGS) ×2 IMPLANT
BLADE CUDA 5.5 (BLADE) IMPLANT
BLADE CUTTER GATOR 3.5 (BLADE) IMPLANT
BLADE CUTTER MENIS 5.5 (BLADE) IMPLANT
BLADE GREAT WHITE 4.2 (BLADE) ×2 IMPLANT
BLADE SURG 15 STRL LF DISP TIS (BLADE) IMPLANT
BLADE SURG 15 STRL SS (BLADE)
BNDG COHESIVE 6X5 TAN NS LF (GAUZE/BANDAGES/DRESSINGS) ×2 IMPLANT
CANISTER SUCT LVC 12 LTR MEDI- (MISCELLANEOUS) ×2 IMPLANT
CANISTER SUCTION 2500CC (MISCELLANEOUS) ×2 IMPLANT
CLOTH BEACON ORANGE TIMEOUT ST (SAFETY) ×2 IMPLANT
DRAPE ARTHROSCOPY W/POUCH 114 (DRAPES) ×2 IMPLANT
DRAPE LG THREE QUARTER DISP (DRAPES) ×2 IMPLANT
DRSG EMULSION OIL 3X3 NADH (GAUZE/BANDAGES/DRESSINGS) ×2 IMPLANT
DRSG PAD ABDOMINAL 8X10 ST (GAUZE/BANDAGES/DRESSINGS) ×6 IMPLANT
DURAPREP 26ML APPLICATOR (WOUND CARE) ×2 IMPLANT
ELECT MENISCUS 165MM 90D (ELECTRODE) ×1 IMPLANT
ELECT REM PT RETURN 9FT ADLT (ELECTROSURGICAL)
ELECTRODE REM PT RTRN 9FT ADLT (ELECTROSURGICAL) IMPLANT
GAUZE SPONGE 4X4 12PLY STRL LF (GAUZE/BANDAGES/DRESSINGS) ×1 IMPLANT
GLOVE ECLIPSE 8.0 STRL XLNG CF (GLOVE) ×2 IMPLANT
GLOVE INDICATOR 8.5 STRL (GLOVE) ×4 IMPLANT
GLOVE SURG ORTHO 6.0 STRL STRW (GLOVE) ×2 IMPLANT
GOWN PREVENTION PLUS LG XLONG (DISPOSABLE) ×2 IMPLANT
GOWN STRL REIN XL XLG (GOWN DISPOSABLE) ×2 IMPLANT
KNEE WRAP E Z 3 GEL PACK (MISCELLANEOUS) ×2 IMPLANT
PACK ARTHROSCOPY DSU (CUSTOM PROCEDURE TRAY) ×2 IMPLANT
PACK BASIN DAY SURGERY FS (CUSTOM PROCEDURE TRAY) ×2 IMPLANT
PADDING CAST ABS 6INX4YD NS (CAST SUPPLIES) ×1
PADDING CAST ABS COTTON 6X4 NS (CAST SUPPLIES) IMPLANT
PADDING CAST COTTON 6X4 STRL (CAST SUPPLIES) ×2 IMPLANT
PENCIL BUTTON HOLSTER BLD 10FT (ELECTRODE) IMPLANT
SET ARTHROSCOPY TUBING (MISCELLANEOUS) ×2
SET ARTHROSCOPY TUBING LN (MISCELLANEOUS) ×1 IMPLANT
SPONGE GAUZE 4X4 12PLY (GAUZE/BANDAGES/DRESSINGS) ×2 IMPLANT
SPONGE SURGIFOAM ABS GEL 12-7 (HEMOSTASIS) IMPLANT
SUT ETHILON 3 0 PS 1 (SUTURE) ×2 IMPLANT
SYR CONTROL 10ML LL (SYRINGE) ×1 IMPLANT
TOWEL OR 17X24 6PK STRL BLUE (TOWEL DISPOSABLE) ×2 IMPLANT
WAND 90 DEG TURBOVAC W/CORD (SURGICAL WAND) ×3 IMPLANT
WATER STERILE IRR 500ML POUR (IV SOLUTION) ×2 IMPLANT

## 2012-08-20 NOTE — Anesthesia Procedure Notes (Signed)
Procedure Name: LMA Insertion Date/Time: 08/20/2012 1:14 PM Performed by: Norva Pavlov Pre-anesthesia Checklist: Patient identified, Emergency Drugs available, Suction available and Patient being monitored Patient Re-evaluated:Patient Re-evaluated prior to inductionOxygen Delivery Method: Circle System Utilized Preoxygenation: Pre-oxygenation with 100% oxygen Intubation Type: IV induction Ventilation: Mask ventilation without difficulty LMA: LMA inserted LMA Size: 4.0 Number of attempts: 1 Airway Equipment and Method: bite block Placement Confirmation: positive ETCO2 Tube secured with: Tape Dental Injury: Teeth and Oropharynx as per pre-operative assessment

## 2012-08-20 NOTE — Interval H&P Note (Signed)
History and Physical Interval Note:  08/20/2012 12:58 PM  Ana Carter  has presented today for surgery, with the diagnosis of left knee medial meniscial tear   The various methods of treatment have been discussed with the patient and family. After consideration of risks, benefits and other options for treatment, the patient has consented to  Procedure(s): LEFT KNEE ARTHROSCOPY WITH MEDIAL MENISCECTOMY (Left) as a surgical intervention .  The patient's history has been reviewed, patient examined, no change in status, stable for surgery.  I have reviewed the patient's chart and labs.  Questions were answered to the patient's satisfaction.     Maicie Vanderloop A

## 2012-08-20 NOTE — Anesthesia Preprocedure Evaluation (Signed)
Anesthesia Evaluation  Patient identified by MRN, date of birth, ID band Patient awake    Reviewed: Allergy & Precautions, H&P , NPO status , Patient's Chart, lab work & pertinent test results  Airway Mallampati: II TM Distance: >3 FB Neck ROM: Full    Dental no notable dental hx.    Pulmonary neg pulmonary ROS,  breath sounds clear to auscultation  Pulmonary exam normal       Cardiovascular negative cardio ROS  Rhythm:Regular Rate:Normal     Neuro/Psych Anxiety negative neurological ROS     GI/Hepatic Neg liver ROS, GERD-  Medicated,  Endo/Other  negative endocrine ROS  Renal/GU negative Renal ROS  negative genitourinary   Musculoskeletal negative musculoskeletal ROS (+)   Abdominal   Peds negative pediatric ROS (+)  Hematology negative hematology ROS (+)   Anesthesia Other Findings   Reproductive/Obstetrics negative OB ROS                           Anesthesia Physical  Anesthesia Plan  ASA: II  Anesthesia Plan: General   Post-op Pain Management:    Induction: Intravenous  Airway Management Planned: LMA  Additional Equipment:   Intra-op Plan:   Post-operative Plan:   Informed Consent: I have reviewed the patients History and Physical, chart, labs and discussed the procedure including the risks, benefits and alternatives for the proposed anesthesia with the patient or authorized representative who has indicated his/her understanding and acceptance.   Dental advisory given  Plan Discussed with: CRNA and Surgeon  Anesthesia Plan Comments:         Anesthesia Quick Evaluation

## 2012-08-20 NOTE — Brief Op Note (Signed)
08/20/2012  1:56 PM  PATIENT:  Ana Carter  47 y.o. female  PRE-OPERATIVE DIAGNOSIS:  left knee medial meniscial tear   POST-OPERATIVE DIAGNOSIS:  Severe Osteoarthritis Left Knee.  PROCEDURE:  Procedure(s): LEFT KNEE ARTHROSCOPY WITH MEDIAL MENISCECTOMY,ABRASION CHRONDOPLASTY OF MEDIAL FEMORAL CONDYLE AND PATELLA AND MICROFRACTURE TECHNIQUE OF MEDIAL FEMORAL CONDYLE DIAGNOSTIC ARTHROSCOPY (Left)  prescribed due to Not indicated. Meniscectomy was necessary.  SURGEON:  Surgeon(s) and Role:    * Jacki Cones, MD - Primary  :   ASSISTANTS:OR Tech   ANESTHESIA:   general  EBL:  Total I/O In: 1000 [I.V.:1000] Out: -   BLOOD ADMINISTERED:none  DRAINS: none   LOCAL MEDICATIONS USED:  MARCAINE 30cc of 0.25% with Epinephrine.    SPECIMEN:  No Specimen  DISPOSITION OF SPECIMEN:  N/A  COUNTS:  YES  TOURNIQUET:  * No tourniquets in log *  DICTATION: .Other Dictation: Dictation Number [id number]  PLAN OF CARE: Discharge to home after PACU  PATIENT DISPOSITION:  PACU - hemodynamically stable.   Delay start of Pharmacological VTE agent (>24hrs) due to surgical blood loss or risk of bleeding: yes

## 2012-08-20 NOTE — Op Note (Signed)
NAMERYANNE, MORAND             ACCOUNT NO.:  [id number]  MEDICAL RECORD NO.:  [id number]  LOCATION:                                 FACILITY:  PHYSICIAN:  Georges Lynch. Caralynn Gelber, M.D.DATE OF BIRTH:  May 04, 1965  DATE OF PROCEDURE:  08/20/2012 DATE OF DISCHARGE:                              OPERATIVE REPORT   SURGEON:  Georges Lynch. Darrelyn Hillock, M.D.  OPERATIVE ASSISTANT:  OR tech.  PREOPERATIVE DIAGNOSES: 1. Questionable medial meniscus tear, left knee. 2. Osteoarthritis, left knee.  POSTOPERATIVE DIAGNOSIS:  Severe osteoarthritis left knee, tricompartmental type.  PROCEDURE:  Under general anesthesia with the patient's left lower extremity in the knee holding device, a routine orthopedic prep and draping of the left lower extremity was carried out.  At this time, appropriate time-out was carried out.  We also marked the appropriate left leg in the holding area.  A small punctate incision was made in suprapatellar pouch after she was given 2 g of IV Ancef.  At this time, inflow cannula was inserted and the knee was distended with saline. Another small punctate incision was made in the anterolateral joint. The arthroscope was entered from lateral approach and a complete diagnostic arthroscopy was carried out.  At this time, then through the medial approach, medial incision was made, and the ArthroCare was inserted.  I went up into the suprapatellar pouch and did an abrasion chondroplasty and synovectomy of the patellofemoral joint in suprapatellar region.  I then went down and examined the ACL and PCL were intact.  Lateral joint, she had a divot literally out on the lateral femoral condyle.  This was a small divot.  The lateral meniscus was intact.  She had definite arthritic changes in the tibial plateau as well.  I went over the medial joint, she had severe degenerative arthritic changes.  I did an abrasion chondroplasty first of the femur. Photos were taken.  I then utilized a  microfracture technique on the medial femoral condyle.  Note, she had rather extensive changes.  She has tricompartmental arthritis.  She is 21, she will need total knee replacement in the future.  All fluid was removed after we irrigated the knee out.  I closed all 3 punctate incisions with 3-0 nylon suture.  I injected a mixture of 30 mL of 0.25% Marcaine with epinephrine in the knee joint.  Sterile Neosporin dressing was applied.  The patient left the operating room in a satisfactory condition.          ______________________________ Georges Lynch Darrelyn Hillock, M.D.     RAG/MEDQ  D:  08/20/2012  T:  08/20/2012  Job:  [id number]

## 2012-08-20 NOTE — Transfer of Care (Signed)
Immediate Anesthesia Transfer of Care Note  Patient: Ana Carter  Procedure(s) Performed: Procedure(s) (LRB): LEFT KNEE ARTHROSCOPY WITH MEDIAL MENISCECTOMY,ABRASION CHRONDOPLASTY OF MEDIAL FEMORAL CONDYLE AND PATELLA AND MICROFRACTURE TECHNIQUE OF MEDIAL FEMORAL CONDYLE DIAGNOSTIC ARTHROSCOPY (Left)  Patient Location: PACU  Anesthesia Type: General  Level of Consciousness: awake, alert  and oriented  Airway & Oxygen Therapy: Patient Spontanous Breathing and Patient connected to face mask oxygen  Post-op Assessment: Report given to PACU RN and Post -op Vital signs reviewed and stable  Post vital signs: Reviewed and stable  Complications: No apparent anesthesia complications

## 2012-08-21 ENCOUNTER — Encounter (HOSPITAL_BASED_OUTPATIENT_CLINIC_OR_DEPARTMENT_OTHER): Payer: Self-pay | Admitting: Orthopedic Surgery

## 2012-08-21 NOTE — Anesthesia Postprocedure Evaluation (Signed)
Anesthesia Post Note  Patient: Ana Carter  Procedure(s) Performed: Procedure(s) (LRB): LEFT KNEE ARTHROSCOPY WITH MEDIAL MENISCECTOMY,ABRASION CHRONDOPLASTY OF MEDIAL FEMORAL CONDYLE AND PATELLA AND MICROFRACTURE TECHNIQUE OF MEDIAL FEMORAL CONDYLE DIAGNOSTIC ARTHROSCOPY (Left)  Anesthesia type: General  Patient location: PACU  Post pain: Pain level controlled  Post assessment: Post-op Vital signs reviewed  Last Vitals:  Filed Vitals:   08/20/12 1625  BP: 93/64  Pulse: 76  Temp: 36.4 C  Resp: 16    Post vital signs: Reviewed  Level of consciousness: sedated  Complications: No apparent anesthesia complications

## 2013-02-20 DIAGNOSIS — D229 Melanocytic nevi, unspecified: Secondary | ICD-10-CM

## 2013-02-20 HISTORY — DX: Melanocytic nevi, unspecified: D22.9

## 2013-05-21 ENCOUNTER — Encounter: Payer: Self-pay | Admitting: Internal Medicine

## 2013-05-21 ENCOUNTER — Ambulatory Visit (INDEPENDENT_AMBULATORY_CARE_PROVIDER_SITE_OTHER): Payer: 59 | Admitting: Internal Medicine

## 2013-05-21 VITALS — BP 100/70 | HR 91 | Temp 98.4°F | Resp 18 | Wt 169.0 lb

## 2013-05-21 DIAGNOSIS — Z90722 Acquired absence of ovaries, bilateral: Secondary | ICD-10-CM

## 2013-05-21 DIAGNOSIS — F419 Anxiety disorder, unspecified: Secondary | ICD-10-CM | POA: Insufficient documentation

## 2013-05-21 DIAGNOSIS — Z9079 Acquired absence of other genital organ(s): Secondary | ICD-10-CM

## 2013-05-21 DIAGNOSIS — N809 Endometriosis, unspecified: Secondary | ICD-10-CM

## 2013-05-21 DIAGNOSIS — K317 Polyp of stomach and duodenum: Secondary | ICD-10-CM | POA: Insufficient documentation

## 2013-05-21 DIAGNOSIS — Z90710 Acquired absence of both cervix and uterus: Secondary | ICD-10-CM

## 2013-05-21 DIAGNOSIS — D131 Benign neoplasm of stomach: Secondary | ICD-10-CM

## 2013-05-21 DIAGNOSIS — K219 Gastro-esophageal reflux disease without esophagitis: Secondary | ICD-10-CM

## 2013-05-21 DIAGNOSIS — F32A Depression, unspecified: Secondary | ICD-10-CM

## 2013-05-21 DIAGNOSIS — F411 Generalized anxiety disorder: Secondary | ICD-10-CM

## 2013-05-21 DIAGNOSIS — F329 Major depressive disorder, single episode, unspecified: Secondary | ICD-10-CM

## 2013-05-21 DIAGNOSIS — F3289 Other specified depressive episodes: Secondary | ICD-10-CM

## 2013-05-21 LAB — CBC WITH DIFFERENTIAL/PLATELET
BASOS ABS: 0.1 10*3/uL (ref 0.0–0.1)
BASOS PCT: 1 % (ref 0–1)
EOS ABS: 0.1 10*3/uL (ref 0.0–0.7)
EOS PCT: 1 % (ref 0–5)
HEMATOCRIT: 42.5 % (ref 36.0–46.0)
Hemoglobin: 14.9 g/dL (ref 12.0–15.0)
Lymphocytes Relative: 25 % (ref 12–46)
Lymphs Abs: 2.3 10*3/uL (ref 0.7–4.0)
MCH: 31.4 pg (ref 26.0–34.0)
MCHC: 35.1 g/dL (ref 30.0–36.0)
MCV: 89.7 fL (ref 78.0–100.0)
MONO ABS: 0.7 10*3/uL (ref 0.1–1.0)
Monocytes Relative: 8 % (ref 3–12)
NEUTROS ABS: 5.9 10*3/uL (ref 1.7–7.7)
Neutrophils Relative %: 65 % (ref 43–77)
Platelets: 382 10*3/uL (ref 150–400)
RBC: 4.74 MIL/uL (ref 3.87–5.11)
RDW: 13.6 % (ref 11.5–15.5)
WBC: 9.1 10*3/uL (ref 4.0–10.5)

## 2013-05-21 LAB — LIPID PANEL
CHOLESTEROL: 213 mg/dL — AB (ref 0–200)
HDL: 48 mg/dL (ref >39)
LDL Cholesterol: 148 mg/dL — ABNORMAL HIGH (ref 0–99)
TRIGLYCERIDES: 83 mg/dL (ref <150)
Total CHOL/HDL Ratio: 4.4 Ratio
VLDL: 17 mg/dL (ref 0–40)

## 2013-05-21 LAB — COMPREHENSIVE METABOLIC PANEL
ALK PHOS: 86 U/L (ref 39–117)
ALT: 26 U/L (ref 0–35)
AST: 23 U/L (ref 0–37)
Albumin: 4.5 g/dL (ref 3.5–5.2)
BILIRUBIN TOTAL: 0.5 mg/dL (ref 0.2–1.2)
BUN: 8 mg/dL (ref 6–23)
CO2: 31 mEq/L (ref 19–32)
CREATININE: 1.08 mg/dL (ref 0.50–1.10)
Calcium: 9.7 mg/dL (ref 8.4–10.5)
Chloride: 104 mEq/L (ref 96–112)
GLUCOSE: 97 mg/dL (ref 70–99)
Potassium: 4.1 mEq/L (ref 3.5–5.3)
Sodium: 142 mEq/L (ref 135–145)
Total Protein: 6.9 g/dL (ref 6.0–8.3)

## 2013-05-21 NOTE — Patient Instructions (Signed)
To lab today  Labs will be mailed to you

## 2013-05-21 NOTE — Progress Notes (Signed)
Subjective:    Patient ID: Ana Carter, female    DOB: [birth year], 48 y.o.   MRN: [id number]  HPI Ana Carter is a new pt here for first visit.  She is a Pharmacist, community.   PMH of anxiety and depression (on multiple meds,  Most recently Pristiq started one month ago),  DJD knees, GERD with stomach polyp.  She is S/P hsyterectomy and BSO for endometriosis  Ana Carter describes that she has been on light duty since May of 2014 due to both knee surgeries.   She describes that she is very anxious about returning to street duty,  Feeling very scared with recent events .  Anxiety excalates at times to panic.  Just started Priztiq and she is seeing her counselor Ana Carter.  She has been in close contact with Ana Carter.    She is also trying to get off AMbien  .  Her psychiatrist is switching her to [facility] but she has not tried it yet  No S/H ideation  No psychotic features  She tells me she drinks one margarita nightly.  Her partner Ana Carter is worried about this.    Allergies  Allergen Reactions  . Codeine Nausea And Vomiting  . Sulfa Antibiotics     unknown   Past Medical History  Diagnosis Date  . Anxiety   . Depression   . GERD (gastroesophageal reflux disease)   . Acute meniscal tear of left knee   . Arthritis     knees and lower back   Past Surgical History  Procedure Laterality Date  . Rhinoplasty  1999  . Breast reduction surgery  2005    and Liposuction of abdomen  . Colonoscopy w/ polypectomy    . Knee arthroscopy with medial menisectomy Right 06/12/2012    Procedure: RIGHT KNEE ARTHROSCOPY WITH PARTIAL MEDIAL MENISCECTOMY And chondroplasty;  Surgeon: Ana Sinning, MD;  Location: [facility];  Service: Orthopedics;  Laterality: Right;  . Steriod injection Left 06/12/2012    Procedure: CORTISONE INJECTION TO LEFT KNEE;  Surgeon: Ana Sinning, MD;  Location: [facility];  Service: Orthopedics;  Laterality: Left;  . Abdominal hysterectomy  2000    and unilateral  salpingoophorectomy/   oher ovary removed in 2001  . Knee arthroscopy with medial menisectomy Left 08/20/2012    Procedure: LEFT KNEE ARTHROSCOPY WITH MEDIAL MENISCECTOMY,ABRASION CHRONDOPLASTY OF MEDIAL FEMORAL CONDYLE AND PATELLA AND MICROFRACTURE TECHNIQUE OF MEDIAL FEMORAL CONDYLE DIAGNOSTIC ARTHROSCOPY;  Surgeon: Ana Bastos, MD;  Location: [facility];  Service: Orthopedics;  Laterality: Left;   History   Social History  . Marital Status: Single    Spouse Name: N/A    Number of Children: N/A  . Years of Education: N/A   Occupational History  . Not on file.   Social History Main Topics  . Smoking status: Former Smoker    Quit date: 06/04/2002  . Smokeless tobacco: Never Used  . Alcohol Use: 2.5 oz/week    5 drink(s) per week     Comment: weekly  . Drug Use: No  . Sexual Activity: Yes   Other Topics Concern  . Not on file   Social History Narrative  . No narrative on file   Family History  Problem Relation Age of Onset  . Diabetes Father   . Cancer Maternal Grandmother     brest    Patient Active Problem List   Diagnosis Date Noted  . Anxiety 05/21/2013  . Depression 05/21/2013  . GERD (  gastroesophageal reflux disease) 05/21/2013  . S/P total hysterectomy and bilateral salpingo-oophorectomy 05/21/2013  . Endometriosis 05/21/2013  . Polyp of stomach 05/21/2013  . Osteoarthritis of left knee 08/20/2012   Current Outpatient Prescriptions on File Prior to Visit  Medication Sig Dispense Refill  . buPROPion (WELLBUTRIN XL) 150 MG 24 Ana Carter tablet Take 150 mg by mouth 2 (two) times daily.      Marland Kitchen estrogen-methylTESTOSTERone (ESTRATEST) 1.25-2.5 MG per tablet Take 1 tablet by mouth daily.      Marland Kitchen lamoTRIgine (LAMICTAL) 200 MG tablet Take 200 mg by mouth 2 (two) times daily.      . metoCLOPramide (REGLAN) 10 MG tablet Take 5 mg by mouth 2 (two) times daily.      . sucralfate (CARAFATE) 1 G tablet Take 0.5 g by mouth 2 (two) times daily.      Marland Kitchen zolpidem  (AMBIEN) 10 MG tablet Take 10 mg by mouth at bedtime as needed for sleep.      Marland Kitchen ALPRAZolam (XANAX) 0.5 MG tablet Take 0.5 mg by mouth 3 (three) times daily as needed for sleep or anxiety.       . diclofenac sodium (VOLTAREN) 1 % GEL Apply 2 g topically 2 (two) times daily as needed (for pain). Apply to inner part of knee      . fish oil-omega-3 fatty acids 1000 MG capsule Take 1 g by mouth daily.      . polyvinyl alcohol (LIQUIFILM TEARS) 1.4 % ophthalmic solution Place 1 drop into both eyes daily as needed (for dry eyes).      . vitamin B-12 (CYANOCOBALAMIN) 1000 MCG tablet Take 1,000 mcg by mouth daily.       No current facility-administered medications on file prior to visit.       Review of Systems    see HPI Objective:   Physical Exam Physical Exam  Nursing note and vitals reviewed.  Constitutional: She is oriented to person, place, and time. She appears well-developed and well-nourished.  HENT:  Head: Normocephalic and atraumatic.  Cardiovascular: Normal rate and regular rhythm. Exam reveals no gallop and no friction rub.  No murmur heard.  Pulmonary/Chest: Breath sounds normal. She has no wheezes. She has no rales.  Neurological: She is alert and oriented to person, place, and time.  Skin: Skin is warm and dry.  Psychiatric: She has a normal mood and affect. Her behavior is normal.       Assessment & Plan:  Anxiety  Situational   Encouraged her to keep upcoming appt with her counselor  And she will be seeing Ana Carter soon.  Will check thyroid and all blood work today.  Will not change any medication  DJD:    GERD  Esophageal ulcer healing well on Sucralfate, dexilant and reglan  Stomach polyp per pt report

## 2013-05-22 ENCOUNTER — Encounter: Payer: Self-pay | Admitting: *Deleted

## 2013-05-22 ENCOUNTER — Ambulatory Visit: Payer: 59 | Admitting: Internal Medicine

## 2013-05-22 LAB — TSH: TSH: 1.345 u[IU]/mL (ref 0.350–4.500)

## 2013-06-09 ENCOUNTER — Other Ambulatory Visit: Payer: Self-pay | Admitting: *Deleted

## 2013-06-09 ENCOUNTER — Telehealth: Payer: Self-pay | Admitting: *Deleted

## 2013-06-09 NOTE — Telephone Encounter (Signed)
Pt would like the name of a psychiatrist for PTSD who do you advise

## 2013-06-10 NOTE — Telephone Encounter (Signed)
Notified pt of Dr Sharin Mons recommendation pt wishes to see a psychologist

## 2013-06-10 NOTE — Telephone Encounter (Signed)
Give names of Dr. Caprice Beaver or Dr. Toy Care or Dr. Lynder Parents (Dr.Cottle is female)

## 2013-06-17 ENCOUNTER — Ambulatory Visit: Payer: 59 | Admitting: Internal Medicine

## 2014-02-04 ENCOUNTER — Encounter: Payer: Self-pay | Admitting: Internal Medicine

## 2014-02-04 ENCOUNTER — Ambulatory Visit (INDEPENDENT_AMBULATORY_CARE_PROVIDER_SITE_OTHER): Payer: 59 | Admitting: Internal Medicine

## 2014-02-04 VITALS — BP 119/71 | HR 92 | Resp 16 | Ht 66.0 in | Wt 188.0 lb

## 2014-02-04 DIAGNOSIS — F32A Depression, unspecified: Secondary | ICD-10-CM

## 2014-02-04 DIAGNOSIS — F329 Major depressive disorder, single episode, unspecified: Secondary | ICD-10-CM

## 2014-02-04 DIAGNOSIS — F419 Anxiety disorder, unspecified: Secondary | ICD-10-CM

## 2014-02-04 LAB — CBC WITH DIFFERENTIAL/PLATELET
Basophils Absolute: 0.1 10*3/uL (ref 0.0–0.1)
Basophils Relative: 1 % (ref 0–1)
EOS ABS: 0.1 10*3/uL (ref 0.0–0.7)
Eosinophils Relative: 1 % (ref 0–5)
HCT: 42.2 % (ref 36.0–46.0)
Hemoglobin: 14.7 g/dL (ref 12.0–15.0)
Lymphocytes Relative: 30 % (ref 12–46)
Lymphs Abs: 2.3 10*3/uL (ref 0.7–4.0)
MCH: 31.9 pg (ref 26.0–34.0)
MCHC: 34.8 g/dL (ref 30.0–36.0)
MCV: 91.5 fL (ref 78.0–100.0)
MPV: 10.8 fL (ref 9.4–12.4)
Monocytes Absolute: 0.7 10*3/uL (ref 0.1–1.0)
Monocytes Relative: 9 % (ref 3–12)
NEUTROS PCT: 59 % (ref 43–77)
Neutro Abs: 4.4 10*3/uL (ref 1.7–7.7)
Platelets: 313 10*3/uL (ref 150–400)
RBC: 4.61 MIL/uL (ref 3.87–5.11)
RDW: 13.4 % (ref 11.5–15.5)
WBC: 7.5 10*3/uL (ref 4.0–10.5)

## 2014-02-04 LAB — COMPREHENSIVE METABOLIC PANEL
ALK PHOS: 86 U/L (ref 39–117)
ALT: 26 U/L (ref 0–35)
AST: 24 U/L (ref 0–37)
Albumin: 4.2 g/dL (ref 3.5–5.2)
BILIRUBIN TOTAL: 0.4 mg/dL (ref 0.2–1.2)
BUN: 13 mg/dL (ref 6–23)
CO2: 29 mEq/L (ref 19–32)
CREATININE: 0.97 mg/dL (ref 0.50–1.10)
Calcium: 8.9 mg/dL (ref 8.4–10.5)
Chloride: 104 mEq/L (ref 96–112)
GLUCOSE: 98 mg/dL (ref 70–99)
Potassium: 4.4 mEq/L (ref 3.5–5.3)
SODIUM: 140 meq/L (ref 135–145)
TOTAL PROTEIN: 6.9 g/dL (ref 6.0–8.3)

## 2014-02-04 NOTE — Progress Notes (Signed)
Subjective:    Patient ID: Ana Carter, female    DOB: Feb 20, 1965, 48 y.o.   MRN: [id number]  HPI 05/2013 note Anxiety Situational Encouraged her to keep upcoming appt with her counselor And she will be seeing Dr. Toy Care soon. Will check thyroid and all blood work today. Will not change any medication  DJD:   GERD Esophageal ulcer healing well on Sucralfate, dexilant and reglan  Stomach polyp per pt report  TODAY    Ana Carter is here for acute visit.    She has an Data processing manager asst job with [employer]  .  Did not have to go back on street duty  Wants my opinion about how to handle her anxiety .  She has been seeing Dr. Toy Care and recently placed on Viibryd (4 months ago)  in addition to her Lamictal, and Wellbutrin  She has long standing history of mixed anxiety and depression.   FH of same in paternal side  Tells me she constantly worries about health, her parents her relationship with Ana Carter and her job.      She will be starting with a new therapist Ana Carter soon  Allergies  Allergen Reactions  . Codeine Nausea And Vomiting  . Sulfa Antibiotics     unknown   Past Medical History  Diagnosis Date  . Anxiety   . Depression   . GERD (gastroesophageal reflux disease)   . Acute meniscal tear of left knee   . Arthritis     knees and lower back   Past Surgical History  Procedure Laterality Date  . Rhinoplasty  1999  . Breast reduction surgery  2005    and Liposuction of abdomen  . Colonoscopy w/ polypectomy    . Knee arthroscopy with medial menisectomy Right 06/12/2012    Procedure: RIGHT KNEE ARTHROSCOPY WITH PARTIAL MEDIAL MENISCECTOMY And chondroplasty;  Surgeon: Magnus Sinning, MD;  Location: [facility];  Service: Orthopedics;  Laterality: Right;  . Steriod injection Left 06/12/2012    Procedure: CORTISONE INJECTION TO LEFT KNEE;  Surgeon: Magnus Sinning, MD;  Location: [facility];  Service: Orthopedics;  Laterality: Left;  . Abdominal hysterectomy   2000    and unilateral salpingoophorectomy/   oher ovary removed in 2001  . Knee arthroscopy with medial menisectomy Left 08/20/2012    Procedure: LEFT KNEE ARTHROSCOPY WITH MEDIAL MENISCECTOMY,ABRASION CHRONDOPLASTY OF MEDIAL FEMORAL CONDYLE AND PATELLA AND MICROFRACTURE TECHNIQUE OF MEDIAL FEMORAL CONDYLE DIAGNOSTIC ARTHROSCOPY;  Surgeon: Tobi Bastos, MD;  Location: [place];  Service: Orthopedics;  Laterality: Left;   History   Social History  . Marital Status: Single    Spouse Name: N/A    Number of Children: N/A  . Years of Education: N/A   Occupational History  . Not on file.   Social History Main Topics  . Smoking status: Former Smoker    Quit date: 06/04/2002  . Smokeless tobacco: Never Used  . Alcohol Use: 2.5 oz/week    5 drink(s) per week     Comment: weekly  . Drug Use: No  . Sexual Activity: Yes   Other Topics Concern  . Not on file   Social History Narrative  . No narrative on file   Family History  Problem Relation Age of Onset  . Diabetes Father   . Cancer Maternal Grandmother     brest    Patient Active Problem List   Diagnosis Date Noted  . Anxiety 05/21/2013  . Depression 05/21/2013  .  GERD (gastroesophageal reflux disease) 05/21/2013  . S/P total hysterectomy and bilateral salpingo-oophorectomy 05/21/2013  . Endometriosis 05/21/2013  . Polyp of stomach 05/21/2013  . Osteoarthritis of left knee 08/20/2012   Current Outpatient Prescriptions on File Prior to Visit  Medication Sig Dispense Refill  . ALPRAZolam (XANAX) 0.5 MG tablet Take 0.5 mg by mouth 3 (three) times daily as needed for sleep or anxiety.     Marland Kitchen buPROPion (WELLBUTRIN XL) 150 MG 24 hr tablet Take 150 mg by mouth 2 (two) times daily.    Marland Kitchen dexlansoprazole (DEXILANT) 60 MG capsule Take 60 mg by mouth daily.    . diclofenac sodium (VOLTAREN) 1 % GEL Apply 2 g topically 2 (two) times daily as needed (for pain). Apply to inner part of knee    .  estrogen-methylTESTOSTERone (ESTRATEST) 1.25-2.5 MG per tablet Take 1 tablet by mouth daily.    . fish oil-omega-3 fatty acids 1000 MG capsule Take 1 g by mouth daily.    Marland Kitchen lamoTRIgine (LAMICTAL) 200 MG tablet Take 200 mg by mouth 2 (two) times daily.    . metoCLOPramide (REGLAN) 10 MG tablet Take 5 mg by mouth 2 (two) times daily.    . polyvinyl alcohol (LIQUIFILM TEARS) 1.4 % ophthalmic solution Place 1 drop into both eyes daily as needed (for dry eyes).    . sucralfate (CARAFATE) 1 G tablet Take 0.5 g by mouth 2 (two) times daily.    . vitamin B-12 (CYANOCOBALAMIN) 1000 MCG tablet Take 1,000 mcg by mouth daily.    Marland Kitchen zolpidem (AMBIEN) 10 MG tablet Take 10 mg by mouth at bedtime as needed for sleep.     No current facility-administered medications on file prior to visit.       Review of Systems See HPI    Objective:   Physical Exam Physical Exam  Nursing note and vitals reviewed.  Constitutional: She is oriented to person, place, and time. She appears well-developed and well-nourished.  HENT:  Head: Normocephalic and atraumatic.  Cardiovascular: Normal rate and regular rhythm. Exam reveals no gallop and no friction rub.  No murmur heard.  Pulmonary/Chest: Breath sounds normal. She has no wheezes. She has no rales.  Neurological: She is alert and oriented to person, place, and time.  Skin: Skin is warm and dry.  Psychiatric: She has a normal mood and affect. Her behavior is normal.        Assessment & Plan:  Mixed anxiety/depression  Anxiety predominant :    Advised to keep appt with new therapist.   ADvised to give new med 6 months and if not improved discuss with Dr. Toy Care.   I do not have any records of what she has tried in past.  My sense is pt wants to come off some of these meds but I advised you would not think of this until her anxiety is under control  Will check labs TSH today    See me in 3-4 months or sooner prn

## 2014-02-04 NOTE — Patient Instructions (Signed)
See me in 3-4 months or as needed

## 2014-02-05 LAB — TSH: TSH: 1.263 u[IU]/mL (ref 0.350–4.500)

## 2014-10-05 ENCOUNTER — Other Ambulatory Visit: Payer: Self-pay

## 2014-10-05 DIAGNOSIS — Z1231 Encounter for screening mammogram for malignant neoplasm of breast: Secondary | ICD-10-CM

## 2014-10-05 DIAGNOSIS — Z9889 Other specified postprocedural states: Secondary | ICD-10-CM

## 2014-11-12 ENCOUNTER — Ambulatory Visit
Admission: RE | Admit: 2014-11-12 | Discharge: 2014-11-12 | Disposition: A | Discharge status: Home / Self Care | Payer: Commercial Managed Care - HMO | Source: Ambulatory Visit

## 2014-11-12 DIAGNOSIS — Z9889 Other specified postprocedural states: Secondary | ICD-10-CM

## 2014-11-12 DIAGNOSIS — Z1231 Encounter for screening mammogram for malignant neoplasm of breast: Secondary | ICD-10-CM

## 2015-12-31 ENCOUNTER — Other Ambulatory Visit: Payer: Self-pay | Admitting: Internal Medicine

## 2015-12-31 DIAGNOSIS — Z1231 Encounter for screening mammogram for malignant neoplasm of breast: Secondary | ICD-10-CM

## 2016-01-26 ENCOUNTER — Ambulatory Visit
Admission: RE | Admit: 2016-01-26 | Discharge: 2016-01-26 | Disposition: A | Discharge status: Home / Self Care | Payer: Commercial Managed Care - HMO | Source: Ambulatory Visit | Attending: Internal Medicine | Admitting: Internal Medicine

## 2016-01-26 ENCOUNTER — Ambulatory Visit: Payer: Commercial Managed Care - HMO

## 2016-01-26 ENCOUNTER — Other Ambulatory Visit: Payer: Self-pay | Admitting: Internal Medicine

## 2016-01-26 DIAGNOSIS — Z1231 Encounter for screening mammogram for malignant neoplasm of breast: Secondary | ICD-10-CM

## 2016-03-29 DIAGNOSIS — Z87891 Personal history of nicotine dependence: Secondary | ICD-10-CM | POA: Diagnosis not present

## 2016-03-29 DIAGNOSIS — Z8719 Personal history of other diseases of the digestive system: Secondary | ICD-10-CM | POA: Diagnosis not present

## 2016-03-29 DIAGNOSIS — Z0000 Encounter for general adult medical examination without abnormal findings: Secondary | ICD-10-CM | POA: Diagnosis not present

## 2016-06-30 ENCOUNTER — Other Ambulatory Visit: Payer: Self-pay | Admitting: Nurse Practitioner

## 2016-06-30 DIAGNOSIS — R072 Precordial pain: Secondary | ICD-10-CM

## 2016-07-06 ENCOUNTER — Encounter (INDEPENDENT_AMBULATORY_CARE_PROVIDER_SITE_OTHER): Payer: Self-pay

## 2016-07-06 ENCOUNTER — Ambulatory Visit (INDEPENDENT_AMBULATORY_CARE_PROVIDER_SITE_OTHER): Payer: Commercial Managed Care - HMO

## 2016-07-06 DIAGNOSIS — R072 Precordial pain: Secondary | ICD-10-CM

## 2016-07-06 LAB — EXERCISE TOLERANCE TEST
CHL CUP STRESS STAGE 1 DBP: 65 mmHg
CHL CUP STRESS STAGE 1 GRADE: 0 %
CHL CUP STRESS STAGE 1 SPEED: 0 mph
CHL CUP STRESS STAGE 2 GRADE: 0 %
CHL CUP STRESS STAGE 2 HR: 97 {beats}/min
CHL CUP STRESS STAGE 3 SPEED: 1 mph
CHL CUP STRESS STAGE 4 SPEED: 1 mph
CHL CUP STRESS STAGE 5 DBP: 54 mmHg
CHL CUP STRESS STAGE 5 GRADE: 10 %
CHL CUP STRESS STAGE 5 HR: 131 {beats}/min
CHL CUP STRESS STAGE 5 SBP: 150 mmHg
CHL CUP STRESS STAGE 5 SPEED: 1.7 mph
CHL CUP STRESS STAGE 6 DBP: 54 mmHg
CHL CUP STRESS STAGE 6 GRADE: 12 %
CHL CUP STRESS STAGE 6 HR: 160 {beats}/min
CHL CUP STRESS STAGE 6 SBP: 159 mmHg
CHL CUP STRESS STAGE 7 HR: 160 {beats}/min
CHL CUP STRESS STAGE 8 GRADE: 0 %
CHL CUP STRESS STAGE 8 SPEED: 0 mph
CHL CUP STRESS STAGE 9 DBP: 79 mmHg
CHL CUP STRESS STAGE 9 GRADE: 0 %
CHL CUP STRESS STAGE 9 SBP: 133 mmHg
CHL CUP STRESS STAGE 9 SPEED: 0 mph
Estimated workload: 7.3 METS
Exercise duration (min): 6 min
Exercise duration (sec): 13 s
MPHR: 169 {beats}/min
Peak HR: 160 {beats}/min
Percent HR: 95 %
Percent of predicted max HR: 94 %
RPE: 17
Rest HR: 89 {beats}/min
Stage 1 HR: 96 {beats}/min
Stage 1 SBP: 123 mmHg
Stage 2 Speed: 0 mph
Stage 3 Grade: 0 %
Stage 3 HR: 95 {beats}/min
Stage 4 Grade: 0 %
Stage 4 HR: 93 {beats}/min
Stage 6 Speed: 2.5 mph
Stage 7 Grade: 14 %
Stage 7 Speed: 3.4 mph
Stage 8 DBP: 51 mmHg
Stage 8 HR: 142 {beats}/min
Stage 8 SBP: 158 mmHg
Stage 9 HR: 96 {beats}/min

## 2016-08-23 DIAGNOSIS — R079 Chest pain, unspecified: Secondary | ICD-10-CM | POA: Diagnosis not present

## 2016-08-23 DIAGNOSIS — E7800 Pure hypercholesterolemia, unspecified: Secondary | ICD-10-CM | POA: Diagnosis not present

## 2016-08-23 DIAGNOSIS — E559 Vitamin D deficiency, unspecified: Secondary | ICD-10-CM | POA: Diagnosis not present

## 2016-09-04 DIAGNOSIS — M542 Cervicalgia: Secondary | ICD-10-CM | POA: Diagnosis not present

## 2016-12-19 ENCOUNTER — Other Ambulatory Visit: Payer: Self-pay | Admitting: Internal Medicine

## 2016-12-19 DIAGNOSIS — Z1231 Encounter for screening mammogram for malignant neoplasm of breast: Secondary | ICD-10-CM

## 2017-01-26 ENCOUNTER — Ambulatory Visit
Admission: RE | Admit: 2017-01-26 | Discharge: 2017-01-26 | Disposition: A | Discharge status: Home / Self Care | Payer: Commercial Managed Care - HMO | Source: Ambulatory Visit | Attending: Internal Medicine | Admitting: Internal Medicine

## 2017-01-26 DIAGNOSIS — Z1231 Encounter for screening mammogram for malignant neoplasm of breast: Secondary | ICD-10-CM

## 2017-03-10 DIAGNOSIS — B9689 Other specified bacterial agents as the cause of diseases classified elsewhere: Secondary | ICD-10-CM | POA: Diagnosis not present

## 2017-03-10 DIAGNOSIS — J019 Acute sinusitis, unspecified: Secondary | ICD-10-CM | POA: Diagnosis not present

## 2017-03-28 DIAGNOSIS — E7800 Pure hypercholesterolemia, unspecified: Secondary | ICD-10-CM | POA: Diagnosis not present

## 2017-03-28 DIAGNOSIS — Z0000 Encounter for general adult medical examination without abnormal findings: Secondary | ICD-10-CM | POA: Diagnosis not present

## 2017-03-28 DIAGNOSIS — E559 Vitamin D deficiency, unspecified: Secondary | ICD-10-CM | POA: Diagnosis not present

## 2017-04-12 DIAGNOSIS — R12 Heartburn: Secondary | ICD-10-CM | POA: Diagnosis not present

## 2017-04-12 DIAGNOSIS — K210 Gastro-esophageal reflux disease with esophagitis: Secondary | ICD-10-CM | POA: Diagnosis not present

## 2017-04-12 DIAGNOSIS — R1013 Epigastric pain: Secondary | ICD-10-CM | POA: Diagnosis not present

## 2017-04-23 ENCOUNTER — Ambulatory Visit: Payer: 59 | Admitting: Family Medicine

## 2017-04-23 ENCOUNTER — Encounter: Payer: Self-pay | Admitting: Family Medicine

## 2017-04-23 DIAGNOSIS — R221 Localized swelling, mass and lump, neck: Secondary | ICD-10-CM | POA: Diagnosis not present

## 2017-04-23 DIAGNOSIS — M25521 Pain in right elbow: Secondary | ICD-10-CM

## 2017-04-23 MED ORDER — NITROGLYCERIN 0.2 MG/HR TD PT24: MEDICATED_PATCH | TRANSDERMAL | 1 refills | Status: DC

## 2017-04-23 NOTE — Assessment & Plan Note (Signed)
brief musculoskeletal exam of the area in question showed normal soft tissue without discrete mass.  There is no concerning features of this.  This is consistent with a normal prominence of the C7 spinous process.  In general we reviewed ergonomic issues.  We also discussed that it is possible that with some arthritis of the lower cervical spine this can be a little more accentuated.  I recommended no additional treatment for this however.

## 2017-04-23 NOTE — Patient Instructions (Addendum)
You have lateral epicondylitis Try to avoid painful activities as much as possible. Ice the area 3-4 times a day for 15 minutes at a time. Aleve 2 tabs twice a day with food for pain and inflammation - take for 7-10 days then as needed. Wear wrist brace to rest the area as often as possible during the day (except when doing your exercise program). Counterforce brace or sleeve as directed can help unload area - wear this regularly if it provides you with relief. Hammer rotation exercise, wrist extension exercise with 1 pound weight - 3 sets of 10 once a day.   Stretching - hold for 20-30 seconds and repeat 3 times. Nitro patches 1/4th patch over affected elbow, change daily. Consider physical therapy, injection if not improving. Follow up in 6 weeks.  The area on your back is normal soft tissue overlying the C7 spinous process (which is more prominent). There is no mass here to be concerned about. This can appear more prominent with some arthritis of the neck or poor posture - make sure computer screen is at eye level.

## 2017-04-23 NOTE — Assessment & Plan Note (Signed)
reviewed home exercises to do daily and how to relatively rest this.  Icing, Aleve 2 tablets twice a day with food.  Counterforce brace with wrist brace.  Nitropatch is one fourth a patch over affected elbow and to change daily.  She will consider physical therapy and/or injection if not improving.  Follow-up in 6 weeks.

## 2017-04-23 NOTE — Progress Notes (Signed)
PCP: Lanice Shirts, MD  Subjective:   HPI: Patient is a 52 y.o. female here for right elbow pain.  Patient reports for about 3-4 weeks she has had severe lateral right elbow pain. She believes that she is potentially hit her funny bone a couple times that may have contributed to this. She describes pain as a burning or stabbing and is 2 out of 10 at rest. She is on a computer all day long and feels this may be contributing as well. She has not had any treatment so far. No swelling, bruising, injury. She is right-handed. She is also interested in me looking at a swollen area on the back of her neck. She is not having any symptoms related to this and no injury prior to this.  Past Medical History:  Diagnosis Date  . Acute meniscal tear of left knee   . Anxiety   . Arthritis    knees and lower back  . Depression   . GERD (gastroesophageal reflux disease)     Current Outpatient Medications on File Prior to Visit  Medication Sig Dispense Refill  . ALPRAZolam (XANAX) 0.5 MG tablet Take 0.5 mg by mouth 3 (three) times daily as needed for sleep or anxiety.     Marland Kitchen buPROPion (WELLBUTRIN XL) 150 MG 24 hr tablet Take 150 mg by mouth 2 (two) times daily.    Marland Kitchen dexlansoprazole (DEXILANT) 60 MG capsule Take 60 mg by mouth daily.    . diclofenac sodium (VOLTAREN) 1 % GEL Apply 2 g topically 2 (two) times daily as needed (for pain). Apply to inner part of knee    . estrogen-methylTESTOSTERone (ESTRATEST) 1.25-2.5 MG per tablet Take 1 tablet by mouth daily.    . eszopiclone (LUNESTA) 2 MG TABS tablet Take 2 mg by mouth at bedtime as needed for sleep. Take immediately before bedtime    . fish oil-omega-3 fatty acids 1000 MG capsule Take 1 g by mouth daily.    Marland Kitchen lamoTRIgine (LAMICTAL) 200 MG tablet Take 200 mg by mouth 2 (two) times daily.    . metoCLOPramide (REGLAN) 10 MG tablet Take 5 mg by mouth 2 (two) times daily.    . polyvinyl alcohol (LIQUIFILM TEARS) 1.4 % ophthalmic solution Place  1 drop into both eyes daily as needed (for dry eyes).    . sucralfate (CARAFATE) 1 G tablet Take 0.5 g by mouth 2 (two) times daily.    . Vilazodone HCl (VIIBRYD) 40 MG TABS Take 40 mg by mouth daily.    . vitamin B-12 (CYANOCOBALAMIN) 1000 MCG tablet Take 1,000 mcg by mouth daily.     No current facility-administered medications on file prior to visit.     Past Surgical History:  Procedure Laterality Date  . ABDOMINAL HYSTERECTOMY  2000   and unilateral salpingoophorectomy/   oher ovary removed in 2001  . BREAST REDUCTION SURGERY  2005   and Liposuction of abdomen  . COLONOSCOPY W/ POLYPECTOMY    . KNEE ARTHROSCOPY WITH MEDIAL MENISECTOMY Right 06/12/2012   Procedure: RIGHT KNEE ARTHROSCOPY WITH PARTIAL MEDIAL MENISCECTOMY And chondroplasty;  Surgeon: Magnus Sinning, MD;  Location: [facility];  Service: Orthopedics;  Laterality: Right;  . KNEE ARTHROSCOPY WITH MEDIAL MENISECTOMY Left 08/20/2012   Procedure: LEFT KNEE ARTHROSCOPY WITH MEDIAL MENISCECTOMY,ABRASION CHRONDOPLASTY OF MEDIAL FEMORAL CONDYLE AND PATELLA AND MICROFRACTURE TECHNIQUE OF MEDIAL FEMORAL CONDYLE DIAGNOSTIC ARTHROSCOPY;  Surgeon: Tobi Bastos, MD;  Location: [place];  Service: Orthopedics;  Laterality: Left;  .  REDUCTION MAMMAPLASTY Bilateral   . RHINOPLASTY  1999  . STERIOD INJECTION Left 06/12/2012   Procedure: CORTISONE INJECTION TO LEFT KNEE;  Surgeon: Magnus Sinning, MD;  Location: [facility];  Service: Orthopedics;  Laterality: Left;    Allergies  Allergen Reactions  . Codeine Nausea And Vomiting  . Sulfa Antibiotics     unknown    Social History   Socioeconomic History  . Marital status: Single    Spouse name: Not on file  . Number of children: Not on file  . Years of education: Not on file  . Highest education level: Not on file  Social Needs  . Financial resource strain: Not on file  . Food insecurity - worry: Not on file  . Food insecurity - inability: Not on file  .  Transportation needs - medical: Not on file  . Transportation needs - non-medical: Not on file  Occupational History  . Not on file  Tobacco Use  . Smoking status: Former Smoker    Last attempt to quit: 06/04/2002    Years since quitting: 14.8  . Smokeless tobacco: Never Used  Substance and Sexual Activity  . Alcohol use: Yes    Alcohol/week: 2.5 oz    Types: 5 drink(s) per week    Comment: weekly  . Drug use: No  . Sexual activity: Yes  Other Topics Concern  . Not on file  Social History Narrative  . Not on file    Family History  Problem Relation Age of Onset  . Diabetes Father   . Cancer Maternal Grandmother        brest     BP 111/62   Pulse 82   Ht 5\' 6"  (1.676 m)   Wt 202 lb (91.6 kg)   BMI 32.60 kg/m   Review of Systems: See HPI above.     Objective:  Physical Exam:  Gen: NAD, comfortable in exam room  Neck: No palpable mass posteriorly.  Noted soft tissue overlying C7 spinous process in area of her concern.  No bruising, other deformity. No TTP .  No midline/bony TTP. FROM.  Right elbow: No gross deformity, swelling, bruising. Full range of motion with 5 out of 5 strength. Tenderness to palpation lateral epicondyle.  No other tenderness of elbow or right upper extremity. Collateral ligaments intact. Pain reproduced with wrist extension.  No pain with third digit extension or supination. Negative Tinel's at Cubital Tunl. and Radial Tunl. Neurovascularly intact.  Left elbow No gross deformity, swelling, bruising. Full range of motion without pain and 5 out of 5 strength. No tenderness. Collateral ligaments intact. Neurovascularly intact. Assessment & Plan:  1.  Right lateral epicondylitis- reviewed home exercises to do daily and how to relatively rest this.  Icing, Aleve 2 tablets twice a day with food.  Counterforce brace with wrist brace.  Nitropatch is one fourth a patch over affected elbow and to change daily.  She will consider physical  therapy and/or injection if not improving.  Follow-up in 6 weeks.  2.  Neck mass-brief musculoskeletal exam of the area in question showed normal soft tissue without discrete mass.  There is no concerning features of this.  This is consistent with a normal prominence of the C7 spinous process.  In general we reviewed ergonomic issues.  We also discussed that it is possible that with some arthritis of the lower cervical spine this can be a little more accentuated.  I recommended no additional treatment for this however.

## 2017-05-02 DIAGNOSIS — Z01419 Encounter for gynecological examination (general) (routine) without abnormal findings: Secondary | ICD-10-CM | POA: Diagnosis not present

## 2017-05-21 DIAGNOSIS — K317 Polyp of stomach and duodenum: Secondary | ICD-10-CM | POA: Diagnosis not present

## 2017-05-21 DIAGNOSIS — R1013 Epigastric pain: Secondary | ICD-10-CM | POA: Diagnosis not present

## 2017-05-21 DIAGNOSIS — K2930 Chronic superficial gastritis without bleeding: Secondary | ICD-10-CM | POA: Diagnosis not present

## 2017-06-04 ENCOUNTER — Ambulatory Visit: Payer: 59 | Admitting: Family Medicine

## 2017-06-04 ENCOUNTER — Encounter: Payer: Self-pay | Admitting: Family Medicine

## 2017-06-04 VITALS — BP 110/69 | HR 69 | Ht 66.0 in | Wt 205.0 lb

## 2017-06-04 DIAGNOSIS — M25521 Pain in right elbow: Secondary | ICD-10-CM | POA: Diagnosis not present

## 2017-06-04 NOTE — Assessment & Plan Note (Signed)
2/2 lateral epicondylitis.  About the same as last visit.  She will continue with home exercises but will add physical therapy with modalities to include iontophoresis.  She will ice this, take Aleve as needed.  Continue with the wrist brace and sleeve.  She will wait a few weeks before trying the Nitropatch again as I think that her rolling it between her fingers caused much higher release of the medication in a short period of time.  Follow-up in 6 weeks consider injection if not improving.

## 2017-06-04 NOTE — Progress Notes (Signed)
PCP: Lanice Shirts, MD  Subjective:   HPI: Patient is a 52 y.o. female here for right elbow pain.  3/11: Patient reports for about 3-4 weeks she has had severe lateral right elbow pain. She believes that she is potentially hit her funny bone a couple times that may have contributed to this. She describes pain as a burning or stabbing and is 2 out of 10 at rest. She is on a computer all day long and feels this may be contributing as well. She has not had any treatment so far. No swelling, bruising, injury. She is right-handed. She is also interested in me looking at a swollen area on the back of her neck. She is not having any symptoms related to this and no injury prior to this.  4/22: Patient reports that she feels about the same as she did last visit. She has been doing home exercise program though without the hammer rotation. She is using a wrist brace and the elbow sleeve. She states she did try the Nitropatch and did well during the day however she forgot that this was on and took it off and rolled the patch between her fingers, developing a very severe headache that required Advil and Excedrin so she has not used them since that time. Pain is still lateral at the right elbow though the severity fluctuates. She is occasionally taking Aleve. The pain can be burning and stabbing. Currently 2/10 level of pain. No skin changes or numbness.  Past Medical History:  Diagnosis Date  . Acute meniscal tear of left knee   . Anxiety   . Arthritis    knees and lower back  . Depression   . GERD (gastroesophageal reflux disease)     Current Outpatient Medications on File Prior to Visit  Medication Sig Dispense Refill  . estrogens, conjugated, (PREMARIN) 1.25 MG tablet Take by mouth.    . ALPRAZolam (XANAX) 0.5 MG tablet Take 0.5 mg by mouth 3 (three) times daily as needed for sleep or anxiety.     Marland Kitchen buPROPion (WELLBUTRIN XL) 150 MG 24 hr tablet Take 150 mg by mouth 2 (two)  times daily.    . citalopram (CELEXA) 40 MG tablet     . dexlansoprazole (DEXILANT) 60 MG capsule Take 60 mg by mouth daily.    . diclofenac sodium (VOLTAREN) 1 % GEL Apply 2 g topically 2 (two) times daily as needed (for pain). Apply to inner part of knee    . estrogen-methylTESTOSTERone (ESTRATEST) 1.25-2.5 MG per tablet Take 1 tablet by mouth daily.    . eszopiclone (LUNESTA) 2 MG TABS tablet Take 2 mg by mouth at bedtime as needed for sleep. Take immediately before bedtime    . fish oil-omega-3 fatty acids 1000 MG capsule Take 1 g by mouth daily.    Marland Kitchen lamoTRIgine (LAMICTAL) 150 MG tablet     . metoCLOPramide (REGLAN) 10 MG tablet Take 5 mg by mouth 2 (two) times daily.    . nitroGLYCERIN (NITRODUR - DOSED IN MG/24 HR) 0.2 mg/hr patch Apply 1/4th patch to affected area, change daily 30 patch 1  . polyvinyl alcohol (LIQUIFILM TEARS) 1.4 % ophthalmic solution Place 1 drop into both eyes daily as needed (for dry eyes).    . sucralfate (CARAFATE) 1 G tablet Take 0.5 g by mouth 2 (two) times daily.    . Vilazodone HCl (VIIBRYD) 40 MG TABS Take 40 mg by mouth daily.    . vitamin B-12 (CYANOCOBALAMIN) 1000 MCG  tablet Take 1,000 mcg by mouth daily.     No current facility-administered medications on file prior to visit.     Past Surgical History:  Procedure Laterality Date  . ABDOMINAL HYSTERECTOMY  2000   and unilateral salpingoophorectomy/   oher ovary removed in 2001  . BREAST REDUCTION SURGERY  2005   and Liposuction of abdomen  . COLONOSCOPY W/ POLYPECTOMY    . KNEE ARTHROSCOPY WITH MEDIAL MENISECTOMY Right 06/12/2012   Procedure: RIGHT KNEE ARTHROSCOPY WITH PARTIAL MEDIAL MENISCECTOMY And chondroplasty;  Surgeon: Magnus Sinning, MD;  Location: [facility];  Service: Orthopedics;  Laterality: Right;  . KNEE ARTHROSCOPY WITH MEDIAL MENISECTOMY Left 08/20/2012   Procedure: LEFT KNEE ARTHROSCOPY WITH MEDIAL MENISCECTOMY,ABRASION CHRONDOPLASTY OF MEDIAL FEMORAL CONDYLE AND PATELLA AND  MICROFRACTURE TECHNIQUE OF MEDIAL FEMORAL CONDYLE DIAGNOSTIC ARTHROSCOPY;  Surgeon: Tobi Bastos, MD;  Location: [place];  Service: Orthopedics;  Laterality: Left;  . REDUCTION MAMMAPLASTY Bilateral   . RHINOPLASTY  1999  . STERIOD INJECTION Left 06/12/2012   Procedure: CORTISONE INJECTION TO LEFT KNEE;  Surgeon: Magnus Sinning, MD;  Location: [facility];  Service: Orthopedics;  Laterality: Left;    Allergies  Allergen Reactions  . Codeine Nausea And Vomiting  . Sulfa Antibiotics     unknown    Social History   Socioeconomic History  . Marital status: Single    Spouse name: Not on file  . Number of children: Not on file  . Years of education: Not on file  . Highest education level: Not on file  Occupational History  . Not on file  Social Needs  . Financial resource strain: Not on file  . Food insecurity:    Worry: Not on file    Inability: Not on file  . Transportation needs:    Medical: Not on file    Non-medical: Not on file  Tobacco Use  . Smoking status: Former Smoker    Last attempt to quit: 06/04/2002    Years since quitting: 15.0  . Smokeless tobacco: Never Used  Substance and Sexual Activity  . Alcohol use: Yes    Alcohol/week: 2.5 oz    Types: 5 drink(s) per week    Comment: weekly  . Drug use: No  . Sexual activity: Yes  Lifestyle  . Physical activity:    Days per week: Not on file    Minutes per session: Not on file  . Stress: Not on file  Relationships  . Social connections:    Talks on phone: Not on file    Gets together: Not on file    Attends religious service: Not on file    Active member of club or organization: Not on file    Attends meetings of clubs or organizations: Not on file    Relationship status: Not on file  . Intimate partner violence:    Fear of current or ex partner: Not on file    Emotionally abused: Not on file    Physically abused: Not on file    Forced sexual activity: Not on file  Other Topics Concern   . Not on file  Social History Narrative  . Not on file    Family History  Problem Relation Age of Onset  . Diabetes Father   . Cancer Maternal Grandmother        brest     BP 110/69   Pulse 69   Ht 5\' 6"  (1.676 m)   Wt 205 lb (93 kg)  BMI 33.09 kg/m   Review of Systems: See HPI above.     Objective:  Physical Exam:  Gen: NAD, comfortable in exam room  Right elbow: No gross deformity, swelling, bruising. Full range of motion with 5 out of 5 strength. Tenderness to palpation lateral epicondyle. No other tenderness of elbow. Pain on wrist extension, third digit extension, and supination. Collateral ligaments intact. Neurovascularly intact distally.  Assessment & Plan:  1.  Right lateral epicondylitis- about the same as last visit.  She will continue with home exercises but will add physical therapy with modalities to include iontophoresis.  She will ice this, take Aleve as needed.  Continue with the wrist brace and sleeve.  She will wait a few weeks before trying the Nitropatch again as I think that her rolling it between her fingers caused much higher release of the medication in a short period of time.  Follow-up in 6 weeks consider injection if not improving.

## 2017-06-04 NOTE — Patient Instructions (Signed)
You have lateral epicondylitis Try to avoid painful activities as much as possible. Ice the area 3-4 times a day for 15 minutes at a time. Aleve 2 tabs twice a day with food for pain and inflammation only as needed at this point. Continue wrist brace and sleeve during the day. Hammer rotation exercise, wrist extension exercise with 1 pound weight - 3 sets of 10 once a day.   Stretching - hold for 20-30 seconds and repeat 3 times. Start physical therapy and do the home exercises only on days you don't go to therapy - we will ask them to do the modalities (ionto, stim, ultrasound). Wait a few weeks before trying the nitro patch again - I think you caused the medicine to be released all at the same time leading to a massive headache - remove it if you're getting a headache if you do start it again. Consider injection if not improving. Follow up in 6 weeks.

## 2017-06-22 DIAGNOSIS — R0789 Other chest pain: Secondary | ICD-10-CM | POA: Diagnosis not present

## 2017-07-16 ENCOUNTER — Ambulatory Visit: Payer: 59 | Admitting: Family Medicine

## 2017-07-23 ENCOUNTER — Other Ambulatory Visit: Payer: Self-pay

## 2017-07-23 ENCOUNTER — Ambulatory Visit: Payer: 59 | Attending: Family Medicine

## 2017-07-23 DIAGNOSIS — M25521 Pain in right elbow: Secondary | ICD-10-CM | POA: Insufficient documentation

## 2017-07-23 NOTE — Therapy (Signed)
[place], [place], [address] Phone: [phone]   Fax:  [phone]  Physical Therapy Evaluation  Patient Details  Name: Ana Carter MRN: [id number] Date of Birth: 31-Dec-1965 Referring Provider: Karlton Lemon, MD   Encounter Date: 07/23/2017  PT End of Session - 07/23/17 1314    Visit Number  1    Number of Visits  8    Date for PT Re-Evaluation  08/24/17    PT Start Time  1232    PT Stop Time  1315    PT Time Calculation (min)  43 min    Activity Tolerance  Patient tolerated treatment well    Behavior During Therapy  [facility] for tasks assessed/performed       Past Medical History:  Diagnosis Date  . Acute meniscal tear of left knee   . Anxiety   . Arthritis    knees and lower back  . Depression   . GERD (gastroesophageal reflux disease)     Past Surgical History:  Procedure Laterality Date  . ABDOMINAL HYSTERECTOMY  2000   and unilateral salpingoophorectomy/   oher ovary removed in 2001  . BREAST REDUCTION SURGERY  2005   and Liposuction of abdomen  . COLONOSCOPY W/ POLYPECTOMY    . KNEE ARTHROSCOPY WITH MEDIAL MENISECTOMY Right 06/12/2012   Procedure: RIGHT KNEE ARTHROSCOPY WITH PARTIAL MEDIAL MENISCECTOMY And chondroplasty;  Surgeon: Magnus Sinning, MD;  Location: [facility];  Service: Orthopedics;  Laterality: Right;  . KNEE ARTHROSCOPY WITH MEDIAL MENISECTOMY Left 08/20/2012   Procedure: LEFT KNEE ARTHROSCOPY WITH MEDIAL MENISCECTOMY,ABRASION CHRONDOPLASTY OF MEDIAL FEMORAL CONDYLE AND PATELLA AND MICROFRACTURE TECHNIQUE OF MEDIAL FEMORAL CONDYLE DIAGNOSTIC ARTHROSCOPY;  Surgeon: Tobi Bastos, MD;  Location: [place];  Service: Orthopedics;  Laterality: Left;  . REDUCTION MAMMAPLASTY Bilateral   . RHINOPLASTY  1999  . STERIOD INJECTION Left 06/12/2012   Procedure: CORTISONE INJECTION TO LEFT KNEE;  Surgeon: Magnus Sinning, MD;  Location[facility];  Service:  Orthopedics;  Laterality: Left;    There were no vitals filed for this visit.   Subjective Assessment - 07/23/17 1242    Subjective  She reports aching started without injury and has happened before tennis elbow.  Did not go away as usual.  No sports or other activity that may cause pain .   Nitro patch helped in past.         Limitations  -- carry heavy bag, gripping. computer work    Patient Stated Goals  Decrease pain, stop pain    Currently in Pain?  Yes    Pain Score  2     Pain Location  Elbow    Pain Orientation  Right;Lateral    Pain Descriptors / Indicators  Aching    Pain Type  Chronic pain    Pain Onset  More than a month ago    Pain Frequency  Constant    Aggravating Factors   Not sure , computer    Pain Relieving Factors  ice ,          [facility] PT Assessment - 07/23/17 0001      Assessment   Medical Diagnosis  RT elbow pain    Referring Provider  Karlton Lemon, MD    Onset Date/Surgical Date  -- 4 months ago    Next MD Visit  As needed    Prior Therapy  No      Precautions   Precautions  None    Required Braces or Orthoses  Other Brace/Splint    Other Brace/Splint  elbow sleve, wrist splint.       Restrictions   Weight Bearing Restrictions  No      Balance Screen   Has the patient fallen in the past 6 months  No    Has the patient had a decrease in activity level because of a fear of falling?   No    Is the patient reluctant to leave their home because of a fear of falling?   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   No pain on finger extension .    No pain on MMT         Strength Assessment Site  Elbow    Right/Left Elbow  Right;Left    Right Elbow Extension  5/5    Left Elbow Flexion  5/5    Left Elbow Extension  5/5      Palpation   Palpation comment  significnat pain Rt later epicondylitis no pain in wrist extensior soft tissue                Objective measurements completed on examination: See above findings.      [facility] Adult PT Treatment/Exercise - 07/23/17 0001      Modalities   Modalities  Iontophoresis      Iontophoresis   Type of Iontophoresis  Dexamethasone    Location  lateral RT elbow epicondyle    Dose  1cc    Time  4-6 hours             PT Education - 07/23/17 1328    Education Details  POC , ionto removal , wrist stretch    Person(s) Educated  Patient    Methods  Explanation;Demonstration;Verbal cues;Handout    Comprehension  Returned demonstration;Verbalized understanding          PT Long Term Goals - 07/23/17 1329      PT LONG TERM GOAL #1   Title  She will be independeent with all HEP issued     Time  4    Period  Weeks    Status  New      PT LONG TERM GOAL #2   Title  She will report pain decreased 50% with work and home tasks.     Time  4    Period  Weeks    Status  New      PT LONG TERM GOAL #3   Title  She will  report able to carry heavier loads in RT hand with 50% less pain.     Time  4    Period  Weeks    Status  New             Plan - 07/23/17 1315    Clinical Impression Statement  Ms Penninger presents with lateral RT elbow pain na dtenderness . She has no pain in extensor tissue.  She has pain on gripping but no weakness. She should improe with modalities , STW and  gentle strength    Clinical Presentation   Stable    Clinical Decision Making  Low    Rehab Potential  Good    PT Frequency  2x / week    PT Duration  4 weeks    PT Treatment/Interventions  Cryotherapy;Iontophoresis 4mg /ml Dexamethasone;Ultrasound;Therapeutic exercise;Patient/family education;Passive range of motion;Taping    PT Next Visit Plan  ionto , STW, tape for tennis elbow,     PT Home Exercise Plan  wrist flexor stretching    Consulted and Agree with Plan of Care  Patient       Patient will benefit from skilled therapeutic intervention in order to improve the following deficits and impairments:  Pain  Visit Diagnosis: Pain in right elbow     Problem List Patient Active Problem List   Diagnosis Date Noted  . Neck mass 04/23/2017  . Right elbow pain 04/23/2017  . Anxiety 05/21/2013  . Depression 05/21/2013  . GERD (gastroesophageal reflux disease) 05/21/2013  . S/P total hysterectomy and bilateral salpingo-oophorectomy 05/21/2013  . Endometriosis 05/21/2013  . Polyp of stomach 05/21/2013  . Osteoarthritis of left knee 08/20/2012    Darrel Hoover  PT 07/23/2017, 2:26 PM  [facility] [address] [place], [place], [address] Phone: [phone]   Fax:  [phone]  Name: Ana Carter MRN: [id number] Date of Birth: [birth year]

## 2017-07-23 NOTE — Patient Instructions (Signed)

## 2017-07-27 ENCOUNTER — Ambulatory Visit: Payer: 59

## 2017-07-27 DIAGNOSIS — M25521 Pain in right elbow: Secondary | ICD-10-CM

## 2017-07-27 NOTE — Therapy (Signed)
[place], [place], [address] Phone: [phone]   Fax:  [phone]  Physical Therapy Treatment  Patient Details  Name: Ana Carter MRN: [id number] Date of Birth: [birth year] Referring Provider: Karlton Lemon, MD   Encounter Date: 07/27/2017  PT End of Session - 07/27/17 1108    Visit Number  2    Number of Visits  8    Date for PT Re-Evaluation  08/24/17    PT Start Time  1105    PT Stop Time  1145    PT Time Calculation (min)  40 min    Activity Tolerance  Patient tolerated treatment well    Behavior During Therapy  [facility] for tasks assessed/performed       Past Medical History:  Diagnosis Date  . Acute meniscal tear of left knee   . Anxiety   . Arthritis    knees and lower back  . Depression   . GERD (gastroesophageal reflux disease)     Past Surgical History:  Procedure Laterality Date  . ABDOMINAL HYSTERECTOMY  2000   and unilateral salpingoophorectomy/   oher ovary removed in 2001  . BREAST REDUCTION SURGERY  2005   and Liposuction of abdomen  . COLONOSCOPY W/ POLYPECTOMY    . KNEE ARTHROSCOPY WITH MEDIAL MENISECTOMY Right 06/12/2012   Procedure: RIGHT KNEE ARTHROSCOPY WITH PARTIAL MEDIAL MENISCECTOMY And chondroplasty;  Surgeon: Magnus Sinning, MD;  [facility];  Service: Orthopedics;  Laterality: Right;  . KNEE ARTHROSCOPY WITH MEDIAL MENISECTOMY Left 08/20/2012   Procedure: LEFT KNEE ARTHROSCOPY WITH MEDIAL MENISCECTOMY,ABRASION CHRONDOPLASTY OF MEDIAL FEMORAL CONDYLE AND PATELLA AND MICROFRACTURE TECHNIQUE OF MEDIAL FEMORAL CONDYLE DIAGNOSTIC ARTHROSCOPY;  Surgeon: Tobi Bastos, MD;  Location: [place];  Service: Orthopedics;  Laterality: Left;  . REDUCTION MAMMAPLASTY Bilateral   . RHINOPLASTY  1999  . STERIOD INJECTION Left 06/12/2012   Procedure: CORTISONE INJECTION TO LEFT KNEE;  Surgeon: Magnus Sinning, MD;  [facility];  Service: Orthopedics;   Laterality: Left;    There were no vitals filed for this visit.  Subjective Assessment - 07/27/17 1201    Subjective  Some better after patch . Kept on 6 hours . No skin irritation.        Currently in Pain?  Yes    Pain Score  2     Pain Location  Elbow    Pain Orientation  Right;Lateral    Pain Descriptors / Indicators  Aching    Pain Type  Chronic pain    Pain Onset  More than a month ago    Pain Frequency  Constant                       [facility] Adult PT Treatment/Exercise - 07/27/17 0001      Elbow Exercises   Other elbow exercises  Initiated HEP with 30 sec wrist flexion stetch followe by 10 reps  1 pound wrist extension doing this 3 sets lifting and 5 sets 30 sec stretch.       Modalities   Modalities  Ultrasound      Ultrasound   Ultrasound Location  1.5 cm2, ! MHz  50%    Ultrasound Parameters  50% , 1Mhz , 1.5 W    Ultrasound Goals  Pain      Iontophoresis   Type of Iontophoresis  Dexamethasone    Location  lateral RT elbow epicondyle    Dose  1cc    Time  4-6 hours      Manual Therapy   Manual Therapy  Soft tissue mobilization;Taping    Soft tissue mobilization  IASTIM rt wrist extensors , and prosimal to epicondyle and around epicondyle.     Kinesiotex  Ligament Correction      Kinesiotix   Ligament Correction  1 strap arpound forearm distal to epicondyle with moderate pressure              PT Education - 07/27/17 1157    Education Details  management of tape , removal of tape or ionto patch if any skin irritation occurs and she was told she could shower with kineseotape and to remove in 3-4 days if no problems    Person(s) Educated  Patient    Methods  Explanation;Demonstration    Comprehension  Verbalized understanding          PT Long Term Goals - 07/23/17 1329      PT LONG TERM GOAL #1   Title  She will be independeent with all HEP issued     Time  4    Period  Weeks    Status  New      PT LONG TERM GOAL #2   Title   She will report pain decreased 50% with work and home tasks.     Time  4    Period  Weeks    Status  New      PT LONG TERM GOAL #3   Title  She will report able to carry heavier loads in RT hand with 50% less pain.     Time  4    Period  Weeks    Status  New            Plan - 07/27/17 1109    Clinical Impression Statement  May have had some improvement with ionto patche . New HEp and tape added for home.   Progress exercise with 2 pounbds of no ill effect from  exer    PT Treatment/Interventions  Cryotherapy;Iontophoresis 4mg /ml Dexamethasone;Ultrasound;Therapeutic exercise;Patient/family education;Passive range of motion;Taping    PT Next Visit Plan  ionto , STW, tape for tennis elbow,     PT Home Exercise Plan  wrist flexor stretching, stretching and lifting program 1 pound    Consulted and Agree with Plan of Care  Patient       Patient will benefit from skilled therapeutic intervention in order to improve the following deficits and impairments:  Pain  Visit Diagnosis: Pain in right elbow     Problem List Patient Active Problem List   Diagnosis Date Noted  . Neck mass 04/23/2017  . Right elbow pain 04/23/2017  . Anxiety 05/21/2013  . Depression 05/21/2013  . GERD (gastroesophageal reflux disease) 05/21/2013  . S/P total hysterectomy and bilateral salpingo-oophorectomy 05/21/2013  . Endometriosis 05/21/2013  . Polyp of stomach 05/21/2013  . Osteoarthritis of left knee 08/20/2012    Darrel Hoover  PT 07/27/2017, 12:02 PM  [facility] [address] [place], [place], [address] Phone: [phone]   Fax:  [phone]  Name: Ana Carter MRN: [id number] Date of Birth: July 25, 1965

## 2017-07-31 ENCOUNTER — Ambulatory Visit: Payer: 59

## 2017-07-31 DIAGNOSIS — M25521 Pain in right elbow: Secondary | ICD-10-CM

## 2017-07-31 NOTE — Therapy (Signed)
[place], [place], [address] Phone: [phone]   Fax:  [phone]  Physical Therapy Treatment  Patient Details  Name: Ana Carter MRN: [id number] Date of Birth: 07-04-1965 Referring Provider: Karlton Lemon, MD   Encounter Date: 07/31/2017  PT End of Session - 07/31/17 1540    Visit Number  3    Number of Visits  8    Date for PT Re-Evaluation  08/24/17    PT Start Time  0305    PT Stop Time  0335    PT Time Calculation (min)  30 min    Activity Tolerance  Patient tolerated treatment well    Behavior During Therapy  [facility] for tasks assessed/performed       Past Medical History:  Diagnosis Date  . Acute meniscal tear of left knee   . Anxiety   . Arthritis    knees and lower back  . Depression   . GERD (gastroesophageal reflux disease)     Past Surgical History:  Procedure Laterality Date  . ABDOMINAL HYSTERECTOMY  2000   and unilateral salpingoophorectomy/   oher ovary removed in 2001  . BREAST REDUCTION SURGERY  2005   and Liposuction of abdomen  . COLONOSCOPY W/ POLYPECTOMY    . KNEE ARTHROSCOPY WITH MEDIAL MENISECTOMY Right 06/12/2012   Procedure: RIGHT KNEE ARTHROSCOPY WITH PARTIAL MEDIAL MENISCECTOMY And chondroplasty;  Surgeon: Magnus Sinning, MD;  [facility];  Service: Orthopedics;  Laterality: Right;  . KNEE ARTHROSCOPY WITH MEDIAL MENISECTOMY Left 08/20/2012   Procedure: LEFT KNEE ARTHROSCOPY WITH MEDIAL MENISCECTOMY,ABRASION CHRONDOPLASTY OF MEDIAL FEMORAL CONDYLE AND PATELLA AND MICROFRACTURE TECHNIQUE OF MEDIAL FEMORAL CONDYLE DIAGNOSTIC ARTHROSCOPY;  Surgeon: Tobi Bastos, MD;  Location: [place];  Service: Orthopedics;  Laterality: Left;  . REDUCTION MAMMAPLASTY Bilateral   . RHINOPLASTY  1999  . STERIOD INJECTION Left 06/12/2012   Procedure: CORTISONE INJECTION TO LEFT KNEE;  Surgeon: Magnus Sinning, MD;  [facility];  Service: Orthopedics;   Laterality: Left;    There were no vitals filed for this visit.  Subjective Assessment - 07/31/17 1537    Subjective  Doing better 50% with no need for tape.  Did exercies over eweekend    Pain Score  1  pain higher with uses and pressure    Pain Location  Elbow    Pain Orientation  Right;Lateral    Pain Type  Chronic pain    Pain Onset  More than a month ago    Pain Frequency  Constant                       [facility] Adult PT Treatment/Exercise - 07/31/17 0001      Elbow Exercises   Other elbow exercises   HEP with 30 sec wrist flexion stetch followe by 10 reps  2 pound wrist extension doing this 3 sets lifting and 5 sets 30 sec stretch.       Ultrasound   Ultrasound Location  1.5 Wcm2 1 Mhz, 50%    Ultrasound Parameters  lateral RT elbow    Ultrasound Goals  Pain      Iontophoresis   Type of Iontophoresis  Dexamethasone    Location  lateral RT elbow epicondyle    Dose  1cc    Time  4-6 hours      Manual Therapy   Soft tissue mobilization  IASTIM rt wrist extensors , and prosimal to  epicondyle and around epicondyle.       Kinesiotix   Ligament Correction  Strap issued to pt she did not use as pain improviing                  PT Long Term Goals - 07/31/17 1541      PT LONG TERM GOAL #1   Title  She will be independeent with all HEP issued     Status  On-going      PT LONG TERM GOAL #2   Title  She will report pain decreased 50% with work and home tasks.     Status  Achieved      PT LONG TERM GOAL #3   Title  She will report able to carry heavier loads in RT hand with 50% less pain.     Status  On-going      PT LONG TERM GOAL #4   Title  She will report pain decreased 75%  and become intermittant    Time  4    Period  Weeks    Status  New            Plan - 07/31/17 1540    Clinical Impression Statement  Inmproving with decr pain.   COntinue current plan incr to 3 pounds on exer if tolerates 2 pounds    PT  Treatment/Interventions  Cryotherapy;Iontophoresis 4mg /ml Dexamethasone;Ultrasound;Therapeutic exercise;Patient/family education;Passive range of motion;Taping    PT Next Visit Plan  ionto , STW, tape for tennis elbow, progress HEP    PT Home Exercise Plan  wrist flexor stretching, stretching and lifting program 1 pound    Consulted and Agree with Plan of Care  Patient       Patient will benefit from skilled therapeutic intervention in order to improve the following deficits and impairments:  Pain  Visit Diagnosis: Pain in right elbow     Problem List Patient Active Problem List   Diagnosis Date Noted  . Neck mass 04/23/2017  . Right elbow pain 04/23/2017  . Anxiety 05/21/2013  . Depression 05/21/2013  . GERD (gastroesophageal reflux disease) 05/21/2013  . S/P total hysterectomy and bilateral salpingo-oophorectomy 05/21/2013  . Endometriosis 05/21/2013  . Polyp of stomach 05/21/2013  . Osteoarthritis of left knee 08/20/2012    Darrel Hoover  PT 07/31/2017, 3:43 PM  [place] [facility] [address] [place], [place], [address] Phone: [phone]   Fax:  [phone]  Name: Ana Carter MRN: [id number] Date of Birth: 1965-08-09

## 2017-08-02 ENCOUNTER — Ambulatory Visit: Payer: 59

## 2017-08-09 ENCOUNTER — Ambulatory Visit: Payer: 59

## 2017-08-09 DIAGNOSIS — M25521 Pain in right elbow: Secondary | ICD-10-CM

## 2017-08-09 NOTE — Therapy (Signed)
[place], [place], [address] Phone: [phone]   Fax:  [phone]  Physical Therapy Treatment  Patient Details  Name: Ana Carter MRN: [id number] Date of Birth: January 11, 1966 Referring Provider: Karlton Lemon, MD   Encounter Date: 08/09/2017  PT End of Session - 08/09/17 1652    Visit Number  4    Number of Visits  8    Date for PT Re-Evaluation  08/24/17    PT Start Time  8182 arrived late    PT Stop Time  0350    PT Time Calculation (min)  33 min    Activity Tolerance  Patient tolerated treatment well    Behavior During Therapy  [facility] for tasks assessed/performed       Past Medical History:  Diagnosis Date  . Acute meniscal tear of left knee   . Anxiety   . Arthritis    knees and lower back  . Depression   . GERD (gastroesophageal reflux disease)     Past Surgical History:  Procedure Laterality Date  . ABDOMINAL HYSTERECTOMY  2000   and unilateral salpingoophorectomy/   oher ovary removed in 2001  . BREAST REDUCTION SURGERY  2005   and Liposuction of abdomen  . COLONOSCOPY W/ POLYPECTOMY    . KNEE ARTHROSCOPY WITH MEDIAL MENISECTOMY Right 06/12/2012   Procedure: RIGHT KNEE ARTHROSCOPY WITH PARTIAL MEDIAL MENISCECTOMY And chondroplasty;  Surgeon: Magnus Sinning, MD;  [facility];  Service: Orthopedics;  Laterality: Right;  . KNEE ARTHROSCOPY WITH MEDIAL MENISECTOMY Left 08/20/2012   Procedure: LEFT KNEE ARTHROSCOPY WITH MEDIAL MENISCECTOMY,ABRASION CHRONDOPLASTY OF MEDIAL FEMORAL CONDYLE AND PATELLA AND MICROFRACTURE TECHNIQUE OF MEDIAL FEMORAL CONDYLE DIAGNOSTIC ARTHROSCOPY;  Surgeon: Tobi Bastos, MD;  Location: [place];  Service: Orthopedics;  Laterality: Left;  . REDUCTION MAMMAPLASTY Bilateral   . RHINOPLASTY  1999  . STERIOD INJECTION Left 06/12/2012   Procedure: CORTISONE INJECTION TO LEFT KNEE;  Surgeon: Magnus Sinning, MD;  [facility];  Service:  Orthopedics;  Laterality: Left;    There were no vitals filed for this visit.  Subjective Assessment - 08/09/17 1649    Subjective  Still doing well . HEP without incr pain.     Currently in Pain?  Yes    Pain Score  1  3-5 depend on activity    Pain Location  Elbow    Pain Orientation  Right;Lateral    Pain Descriptors / Indicators  Aching    Pain Type  Chronic pain    Pain Onset  More than a month ago    Pain Frequency  Constant    Aggravating Factors   pressure and gripping some times     Pain Relieving Factors  cold                       [facility] Adult PT Treatment/Exercise - 08/09/17 0001      Elbow Exercises   Other elbow exercises   HEP with 30 sec wrist flexion stetch followe by 10 reps  3 pound wrist extension doing this 3 sets lifting and 5 sets 30 sec stretch.       Ultrasound   Ultrasound Location  Lateral RT elbow    Ultrasound Parameters  1.5wc2, 1 MHz, 100%    Ultrasound Goals  Pain      Iontophoresis   Type of Iontophoresis  Dexamethasone    Location  lateral RT elbow epicondyle  Dose  1cc    Time  4-6 hours      Manual Therapy   Soft tissue mobilization  IASTIM rt wrist extensors , and prosimal to epicondyle and around epicondyle.                   PT Long Term Goals - 08/09/17 1656      PT LONG TERM GOAL #1   Title  She will be independeent with all HEP issued     Status  On-going      PT LONG TERM GOAL #2   Title  She will report pain decreased 50% with work and home tasks.     Status  Achieved      PT LONG TERM GOAL #3   Title  She will report able to carry heavier loads in RT hand with 50% less pain.     Status  On-going      PT LONG TERM GOAL #4   Title  She will report pain decreased 75%  and become intermittant    Status  On-going            Plan - 08/09/17 1653    Clinical Impression Statement  Pain and use of RT arm improving . She was able to do 3 pounds lifting without incr pain.   She will incr  weight at home.    Incr to 4 pounds next week and continiue ionto and manual    PT Treatment/Interventions  Cryotherapy;Iontophoresis 4mg /ml Dexamethasone;Ultrasound;Therapeutic exercise;Patient/family education;Passive range of motion;Taping    PT Next Visit Plan  ionto , STW, tape for tennis elbow, progress HEP incr to 4 pounds    PT Home Exercise Plan  wrist flexor stretching, stretching and lifting program 3 pound    Consulted and Agree with Plan of Care  Patient       Patient will benefit from skilled therapeutic intervention in order to improve the following deficits and impairments:  Pain  Visit Diagnosis: Pain in right elbow     Problem List Patient Active Problem List   Diagnosis Date Noted  . Neck mass 04/23/2017  . Right elbow pain 04/23/2017  . Anxiety 05/21/2013  . Depression 05/21/2013  . GERD (gastroesophageal reflux disease) 05/21/2013  . S/P total hysterectomy and bilateral salpingo-oophorectomy 05/21/2013  . Endometriosis 05/21/2013  . Polyp of stomach 05/21/2013  . Osteoarthritis of left knee 08/20/2012    Darrel Hoover  PT 08/09/2017, 5:00 PM  [facility] [address] [place], [place], [address] Phone: [phone]   Fax:  [phone]  Name: Ana Carter MRN: [id number] Date of Birth: Sep 29, 1965

## 2017-08-10 ENCOUNTER — Encounter

## 2017-08-14 ENCOUNTER — Ambulatory Visit: Payer: 59 | Attending: Family Medicine

## 2017-08-14 DIAGNOSIS — M25521 Pain in right elbow: Secondary | ICD-10-CM | POA: Insufficient documentation

## 2017-08-14 NOTE — Therapy (Signed)
[place], [place], [address] Phone: [phone]   Fax:  [phone]  Physical Therapy Treatment  Patient Details  Name: Ana Carter MRN: [id number] Date of Birth: 1965-07-24 Referring Provider: Karlton Lemon, MD   Encounter Date: 08/14/2017  PT End of Session - 08/14/17 1329    Visit Number  5    Number of Visits  8    Date for PT Re-Evaluation  08/24/17    PT Start Time  0125    PT Stop Time  0204    PT Time Calculation (min)  39 min    Activity Tolerance  Patient tolerated treatment well    Behavior During Therapy  [facility] for tasks assessed/performed       Past Medical History:  Diagnosis Date  . Acute meniscal tear of left knee   . Anxiety   . Arthritis    knees and lower back  . Depression   . GERD (gastroesophageal reflux disease)     Past Surgical History:  Procedure Laterality Date  . ABDOMINAL HYSTERECTOMY  2000   and unilateral salpingoophorectomy/   oher ovary removed in 2001  . BREAST REDUCTION SURGERY  2005   and Liposuction of abdomen  . COLONOSCOPY W/ POLYPECTOMY    . KNEE ARTHROSCOPY WITH MEDIAL MENISECTOMY Right 06/12/2012   Procedure: RIGHT KNEE ARTHROSCOPY WITH PARTIAL MEDIAL MENISCECTOMY And chondroplasty;  Surgeon: Magnus Sinning, MD;  Location: [facility];  Service: Orthopedics;  Laterality: Right;  . KNEE ARTHROSCOPY WITH MEDIAL MENISECTOMY Left 08/20/2012   Procedure: LEFT KNEE ARTHROSCOPY WITH MEDIAL MENISCECTOMY,ABRASION CHRONDOPLASTY OF MEDIAL FEMORAL CONDYLE AND PATELLA AND MICROFRACTURE TECHNIQUE OF MEDIAL FEMORAL CONDYLE DIAGNOSTIC ARTHROSCOPY;  Surgeon: Tobi Bastos, MD;  Location: [place];  Service: Orthopedics;  Laterality: Left;  . REDUCTION MAMMAPLASTY Bilateral   . RHINOPLASTY  1999  . STERIOD INJECTION Left 06/12/2012   Procedure: CORTISONE INJECTION TO LEFT KNEE;  Surgeon: Magnus Sinning, MD;  Location: [facility];  Service: Orthopedics;   Laterality: Left;    There were no vitals filed for this visit.  Subjective Assessment - 08/14/17 1328    Subjective  Using RT arm as usual.     Pain Score  1     Pain Location  Elbow    Pain Orientation  Right;Lateral    Pain Descriptors / Indicators  Aching    Pain Type  Chronic pain    Pain Onset  More than a month ago    Pain Frequency  Intermittent    Aggravating Factors   grip and using hand    Pain Relieving Factors  cold                       [facility] Adult PT Treatment/Exercise - 08/14/17 0001      Elbow Exercises   Other elbow exercises   HEP with 30 sec wrist flexion stetch followe by 10 reps  4 pound wrist extension doing this 3 sets lifting and 5 sets 30 sec stretch.       Ultrasound   Ultrasound Location  Lateral RT elbow    Ultrasound Parameters  1.5 Wcm2, 100% 1MHz,     Ultrasound Goals  Pain      Iontophoresis   Type of Iontophoresis  Dexamethasone    Location  lateral RT elbow epicondyle    Dose  1cc    Time  4-6 hours      Manual  Therapy   Soft tissue mobilization  IASTIM rt wrist extensors , and proximal to epicondyle and around epicondyle. use of hands also                  PT Long Term Goals - 08/09/17 1656      PT LONG TERM GOAL #1   Title  She will be independeent with all HEP issued     Status  On-going      PT LONG TERM GOAL #2   Title  She will report pain decreased 50% with work and home tasks.     Status  Achieved      PT LONG TERM GOAL #3   Title  She will report able to carry heavier loads in RT hand with 50% less pain.     Status  On-going      PT LONG TERM GOAL #4   Title  She will report pain decreased 75%  and become intermittant    Status  On-going            Plan - 08/14/17 1352    Clinical Impression Statement  PAin less at times. low levels of  pain unless uses arm alot.    tolerating heavier weight without increase  pain.  progressing toward goals    PT Treatment/Interventions   Cryotherapy;Iontophoresis 4mg /ml Dexamethasone;Ultrasound;Therapeutic exercise;Patient/family education;Passive range of motion;Taping    PT Next Visit Plan  ionto , STW, tape for tennis elbow, progress HEP incr to 5 pounds , trial of hammer sup/pro   PT Home Exercise Plan  wrist flexor stretching, stretching and lifting program 3 pound    Consulted and Agree with Plan of Care  Patient       Patient will benefit from skilled therapeutic intervention in order to improve the following deficits and impairments:  Pain  Visit Diagnosis: Pain in right elbow     Problem List Patient Active Problem List   Diagnosis Date Noted  . Neck mass 04/23/2017  . Right elbow pain 04/23/2017  . Anxiety 05/21/2013  . Depression 05/21/2013  . GERD (gastroesophageal reflux disease) 05/21/2013  . S/P total hysterectomy and bilateral salpingo-oophorectomy 05/21/2013  . Endometriosis 05/21/2013  . Polyp of stomach 05/21/2013  . Osteoarthritis of left knee 08/20/2012    Darrel Hoover  PT 08/14/2017, 2:03 PM  [facility] [address] [place], [place], [address] Phone: [phone]   Fax:  [phone]  Name: Ana Carter MRN: [id number] Date of Birth: Aug 21, 1965

## 2017-08-20 ENCOUNTER — Ambulatory Visit: Payer: 59

## 2017-08-20 DIAGNOSIS — M25521 Pain in right elbow: Secondary | ICD-10-CM

## 2017-08-20 NOTE — Therapy (Addendum)
[place], [place], [address] Phone: [phone]   Fax:  [phone]  Physical Therapy Treatment/Discharge  Patient Details  Name: Ana Carter MRN: [id number] Date of Birth: 12/13/1965 Referring Provider: Karlton Lemon, MD   Encounter Date: 08/20/2017  PT End of Session - 08/20/17 1238    Visit Number  6    Number of Visits  8    Date for PT Re-Evaluation  08/24/17    PT Start Time  1233    PT Stop Time  1312    PT Time Calculation (min)  39 min    Activity Tolerance  Patient tolerated treatment well    Behavior During Therapy  [facility] for tasks assessed/performed       Past Medical History:  Diagnosis Date  . Acute meniscal tear of left knee   . Anxiety   . Arthritis    knees and lower back  . Depression   . GERD (gastroesophageal reflux disease)     Past Surgical History:  Procedure Laterality Date  . ABDOMINAL HYSTERECTOMY  2000   and unilateral salpingoophorectomy/   oher ovary removed in 2001  . BREAST REDUCTION SURGERY  2005   and Liposuction of abdomen  . COLONOSCOPY W/ POLYPECTOMY    . KNEE ARTHROSCOPY WITH MEDIAL MENISECTOMY Right 06/12/2012   Procedure: RIGHT KNEE ARTHROSCOPY WITH PARTIAL MEDIAL MENISCECTOMY And chondroplasty;  Surgeon: Magnus Sinning, MD;  [facility];  Service: Orthopedics;  Laterality: Right;  . KNEE ARTHROSCOPY WITH MEDIAL MENISECTOMY Left 08/20/2012   Procedure: LEFT KNEE ARTHROSCOPY WITH MEDIAL MENISCECTOMY,ABRASION CHRONDOPLASTY OF MEDIAL FEMORAL CONDYLE AND PATELLA AND MICROFRACTURE TECHNIQUE OF MEDIAL FEMORAL CONDYLE DIAGNOSTIC ARTHROSCOPY;  Surgeon: Tobi Bastos, MD;  Location: [place];  Service: Orthopedics;  Laterality: Left;  . REDUCTION MAMMAPLASTY Bilateral   . RHINOPLASTY  1999  . STERIOD INJECTION Left 06/12/2012   Procedure: CORTISONE INJECTION TO LEFT KNEE;  Surgeon: Magnus Sinning, MD;  [facility];  Service:  Orthopedics;  Laterality: Left;    There were no vitals filed for this visit.  Subjective Assessment - 08/20/17 1314    Subjective  Still better and pain 1 or less  but can incr with use of RT arm.  Wonders why AM pain some worse than during day.     Currently in Pain?  Yes    Pain Score  1     Pain Location  Elbow    Pain Orientation  Right;Lateral    Pain Descriptors / Indicators  Aching    Pain Type  Chronic pain    Pain Onset  More than a month ago    Pain Frequency  Intermittent                       [facility] Adult PT Treatment/Exercise - 08/20/17 0001      Elbow Exercises   Other elbow exercises   with 30 sec wrist flexion stetch followe by 10 reps  6 pound wrist extension doing this 3 sets lifting and 5 sets 30 sec stretch.       Ultrasound   Ultrasound Location  Lt lateral elbow    Ultrasound Parameters  1.5 Wcm2    100%   1MHz    Ultrasound Goals  Pain      Iontophoresis   Type of Iontophoresis  Dexamethasone    Location  lateral RT elbow epicondyle    Dose  1cc  Time  4-6 hours      Manual Therapy   Soft tissue mobilization  IASTIM rt wrist extensors , and proximal to epicondyle and around epicondyle. use of hands also             PT Education - 08/20/17 1315    Education Details  incr to 5 pounds at home then incr with reps per set    Person(s) Educated  Patient    Methods  Explanation    Comprehension  Verbalized understanding          PT Long Term Goals - 08/09/17 1656      PT LONG TERM GOAL #1   Title  She will be independeent with all HEP issued     Status  On-going      PT LONG TERM GOAL #2   Title  She will report pain decreased 50% with work and home tasks.     Status  Achieved      PT LONG TERM GOAL #3   Title  She will report able to carry heavier loads in RT hand with 50% less pain.     Status  On-going      PT LONG TERM GOAL #4   Title  She will report pain decreased 75%  and become intermittant    Status   On-going            Plan - 08/20/17 1238    Clinical Impression Statement  Contnues with very small spot pain that is till quite tender but the areas is   the head of pencil or smaller.   Continue per plan then hold or discharge  at that time.    PT Treatment/Interventions  Cryotherapy;Iontophoresis 66m/ml Dexamethasone;Ultrasound;Therapeutic exercise;Patient/family education;Passive range of motion;Taping    PT Next Visit Plan  ionto , STW, tape for tennis elbow, progress  incr  5 pounds to 15 reps per set.     PT Home Exercise Plan  wrist flexor stretching, stretching and lifting program 3 pound    Consulted and Agree with Plan of Care  Patient       Patient will benefit from skilled therapeutic intervention in order to improve the following deficits and impairments:  Pain  Visit Diagnosis: Pain in right elbow     Problem List Patient Active Problem List   Diagnosis Date Noted  . Neck mass 04/23/2017  . Right elbow pain 04/23/2017  . Anxiety 05/21/2013  . Depression 05/21/2013  . GERD (gastroesophageal reflux disease) 05/21/2013  . S/P total hysterectomy and bilateral salpingo-oophorectomy 05/21/2013  . Endometriosis 05/21/2013  . Polyp of stomach 05/21/2013  . Osteoarthritis of left knee 08/20/2012    CDarrel Hoover PT 08/20/2017, 1:16 PM  [facility] [place] [address]Phone: [phone]  Fax:  [phone] Name: JAIREANNA LUELLENMRN: [id number] of Birth: [birth year] PHYSICAL THERAPY DISCHARGE SUMMARY  Visits from Start of Care: 6 Current functional level related to goals / functional outcomes: See above though pain not completely resolved.  She canceled last appointment being out of town but did not return We would be happy to see her again with new order.   Remaining deficits: Continued with elbow pain but improved   Education / Equipment: HEP for strength and  stretch Plan:  Patient goals were partially met. Patient is being discharged due to not returning since the last visit.  ?????   Pearson Forster PT    09/27/17

## 2017-08-24 ENCOUNTER — Ambulatory Visit: Payer: 59

## 2017-09-24 ENCOUNTER — Other Ambulatory Visit: Payer: Self-pay | Admitting: Internal Medicine

## 2017-09-24 ENCOUNTER — Ambulatory Visit
Admission: RE | Admit: 2017-09-24 | Discharge: 2017-09-24 | Disposition: A | Discharge status: Home / Self Care | Payer: 59 | Source: Ambulatory Visit | Attending: Internal Medicine | Admitting: Internal Medicine

## 2017-09-24 DIAGNOSIS — M542 Cervicalgia: Secondary | ICD-10-CM | POA: Diagnosis not present

## 2017-09-24 DIAGNOSIS — E7800 Pure hypercholesterolemia, unspecified: Secondary | ICD-10-CM | POA: Diagnosis not present

## 2017-09-24 DIAGNOSIS — R748 Abnormal levels of other serum enzymes: Secondary | ICD-10-CM | POA: Diagnosis not present

## 2017-10-03 DIAGNOSIS — M545 Low back pain: Secondary | ICD-10-CM | POA: Diagnosis not present

## 2017-10-03 DIAGNOSIS — R748 Abnormal levels of other serum enzymes: Secondary | ICD-10-CM | POA: Diagnosis not present

## 2017-10-03 DIAGNOSIS — E7800 Pure hypercholesterolemia, unspecified: Secondary | ICD-10-CM | POA: Diagnosis not present

## 2017-10-09 DIAGNOSIS — K2970 Gastritis, unspecified, without bleeding: Secondary | ICD-10-CM | POA: Diagnosis not present

## 2017-10-10 ENCOUNTER — Ambulatory Visit: Payer: 59 | Admitting: Family Medicine

## 2017-12-17 ENCOUNTER — Other Ambulatory Visit: Payer: Self-pay | Admitting: Internal Medicine

## 2017-12-17 DIAGNOSIS — Z1231 Encounter for screening mammogram for malignant neoplasm of breast: Secondary | ICD-10-CM

## 2018-01-28 ENCOUNTER — Ambulatory Visit
Admission: RE | Admit: 2018-01-28 | Discharge: 2018-01-28 | Disposition: A | Discharge status: Home / Self Care | Payer: 59 | Source: Ambulatory Visit | Attending: Internal Medicine | Admitting: Internal Medicine

## 2018-01-28 DIAGNOSIS — Z1231 Encounter for screening mammogram for malignant neoplasm of breast: Secondary | ICD-10-CM | POA: Diagnosis not present

## 2018-03-11 DIAGNOSIS — R509 Fever, unspecified: Secondary | ICD-10-CM | POA: Diagnosis not present

## 2018-03-11 DIAGNOSIS — B349 Viral infection, unspecified: Secondary | ICD-10-CM | POA: Diagnosis not present

## 2018-03-19 DIAGNOSIS — J111 Influenza due to unidentified influenza virus with other respiratory manifestations: Secondary | ICD-10-CM | POA: Diagnosis not present

## 2018-04-15 ENCOUNTER — Ambulatory Visit: Payer: 59 | Admitting: Sports Medicine

## 2018-04-20 ENCOUNTER — Other Ambulatory Visit: Payer: Self-pay

## 2018-04-20 ENCOUNTER — Emergency Department (HOSPITAL_BASED_OUTPATIENT_CLINIC_OR_DEPARTMENT_OTHER)
Admission: EM | Admit: 2018-04-20 | Discharge: 2018-04-21 | Disposition: A | Discharge status: Home / Self Care | Payer: 59 | Attending: Emergency Medicine | Admitting: Emergency Medicine

## 2018-04-20 ENCOUNTER — Encounter (HOSPITAL_BASED_OUTPATIENT_CLINIC_OR_DEPARTMENT_OTHER): Payer: Self-pay | Admitting: *Deleted

## 2018-04-20 DIAGNOSIS — Z79899 Other long term (current) drug therapy: Secondary | ICD-10-CM | POA: Diagnosis not present

## 2018-04-20 DIAGNOSIS — R69 Illness, unspecified: Secondary | ICD-10-CM

## 2018-04-20 DIAGNOSIS — R509 Fever, unspecified: Secondary | ICD-10-CM | POA: Diagnosis present

## 2018-04-20 DIAGNOSIS — R000 Tachycardia, unspecified: Secondary | ICD-10-CM | POA: Diagnosis not present

## 2018-04-20 DIAGNOSIS — J111 Influenza due to unidentified influenza virus with other respiratory manifestations: Secondary | ICD-10-CM | POA: Diagnosis not present

## 2018-04-20 DIAGNOSIS — Z87891 Personal history of nicotine dependence: Secondary | ICD-10-CM | POA: Diagnosis not present

## 2018-04-20 DIAGNOSIS — M791 Myalgia, unspecified site: Secondary | ICD-10-CM | POA: Diagnosis not present

## 2018-04-20 MED ORDER — OSELTAMIVIR PHOSPHATE 75 MG PO CAPS: 75.0000 mg | ORAL_CAPSULE | Freq: Two times a day (BID) | ORAL | 0 refills | Status: DC

## 2018-04-20 MED ORDER — OSELTAMIVIR PHOSPHATE 75 MG PO CAPS
75.0000 mg | ORAL_CAPSULE | Freq: Once | ORAL | Status: AC
  Administered 2018-04-20: 75 mg via ORAL
  Filled 2018-04-20: qty 1

## 2018-04-20 NOTE — ED Triage Notes (Signed)
Pt reports temp 100 today. Took tylenol at 6pm. C/o cough, sore throat, body aches today. Reports she has already had the flu this year

## 2018-04-20 NOTE — ED Notes (Signed)
Pt and family understood dc material. NAD noted. Script sent in electronically. All questions to satisfaction. Pt and family escorted to check out counter.

## 2018-04-20 NOTE — ED Provider Notes (Signed)
[place] EMERGENCY DEPARTMENT Provider Note   CSN: [id number] Arrival date & time: 04/20/18  2023    History   Chief Complaint Chief Complaint  Patient presents with  . Cough  . Fever    HPI Ana Carter is a 53 y.o. female.     Patient felt fine yesterday.  Patient with onset today of fever to 100 associated cough sore throat body aches some congestion patient tested positive for influenza being earlier in the year and she did have her flu shot.  No nausea vomiting or diarrhea.  Symptoms hit the patient suddenly.  Past medical history noncontributory otherwise.     Past Medical History:  Diagnosis Date  . Anxiety     There are no active problems to display for this patient.   Past Surgical History:  Procedure Laterality Date  . ABDOMINAL HYSTERECTOMY    . BREAST SURGERY    . KNEE SURGERY Bilateral   . RHINOPLASTY       OB History   No obstetric history on file.      Home Medications    Prior to Admission medications   Medication Sig Start Date End Date Taking? Authorizing Provider  buPROPion (WELLBUTRIN XL) 150 MG 24 hr tablet Take 450 mg by mouth daily.   Yes [provider]  citalopram (CELEXA) 20 MG tablet Take 20 mg by mouth daily.   Yes [provider]  Dexlansoprazole (DEXILANT PO) Take by mouth.   Yes [provider]  estrogens-methylTEST (ESTRATEST) 1.25-2.5 MG tablet Take 1 tablet by mouth daily.   Yes [provider]  lamoTRIgine (LAMICTAL) 150 MG tablet Take 300 mg by mouth daily.   Yes [provider]  oseltamivir (TAMIFLU) 75 MG capsule Take 1 capsule (75 mg total) by mouth every 12 (twelve) hours. 04/20/18   Fredia Sorrow, MD    Family History No family history on file.  Social History Social History   Tobacco Use  . Smoking status: Former Research scientist (life sciences)  . Smokeless tobacco: Never Used  Substance Use Topics  . Alcohol use: Yes    Comment: 1 drink/day  . Drug use: Never      Allergies   Codeine and Sulfa antibiotics   Review of Systems Review of Systems  Constitutional: Positive for fever. Negative for chills.  HENT: Positive for congestion and sore throat. Negative for rhinorrhea.   Eyes: Negative for visual disturbance.  Respiratory: Positive for cough. Negative for shortness of breath.   Cardiovascular: Negative for chest pain and leg swelling.  Gastrointestinal: Negative for abdominal pain, diarrhea, nausea and vomiting.  Genitourinary: Negative for dysuria.  Musculoskeletal: Positive for myalgias. Negative for back pain and neck pain.  Skin: Negative for rash.  Neurological: Negative for dizziness, light-headedness and headaches.  Hematological: Does not bruise/bleed easily.  Psychiatric/Behavioral: Negative for confusion.     Physical Exam Updated Vital Signs BP 116/70   Pulse 88   Temp 98.7 F (37.1 C) (Oral)   Resp 18   Ht 1.676 m (5\' 6" )   Wt 91.2 kg   SpO2 97%   BMI 32.44 kg/m   Physical Exam Vitals signs and nursing note reviewed.  Constitutional:      General: She is not in acute distress.    Appearance: Normal appearance. She is well-developed. She is not toxic-appearing.  HENT:     Head: Normocephalic and atraumatic.     Nose: Congestion present.     Mouth/Throat:     Mouth: Mucous membranes  are moist.     Pharynx: Posterior oropharyngeal erythema present. No oropharyngeal exudate.  Eyes:     Extraocular Movements: Extraocular movements intact.     Conjunctiva/sclera: Conjunctivae normal.     Pupils: Pupils are equal, round, and reactive to light.  Neck:     Musculoskeletal: Normal range of motion and neck supple. No neck rigidity.  Cardiovascular:     Rate and Rhythm: Regular rhythm. Tachycardia present.     Heart sounds: No murmur.  Pulmonary:     Effort: Pulmonary effort is normal. No respiratory distress.     Breath sounds: Normal breath sounds.  Abdominal:     Palpations: Abdomen is soft.      Tenderness: There is no abdominal tenderness.  Musculoskeletal: Normal range of motion.  Skin:    General: Skin is warm and dry.     Capillary Refill: Capillary refill takes less than 2 seconds.  Neurological:     General: No focal deficit present.     Mental Status: She is alert and oriented to person, place, and time.      ED Treatments / Results  Labs (all labs ordered are listed, but only abnormal results are displayed) Labs Reviewed - No data to display  EKG None  Radiology No results found.  Procedures Procedures (including critical care time)  Medications Ordered in ED Medications  oseltamivir (TAMIFLU) capsule 75 mg (75 mg Oral Given 04/20/18 2252)     Initial Impression / Assessment and Plan / ED Course  I have reviewed the triage vital signs and the nursing notes.  Pertinent labs & imaging results that were available during my care of the patient were reviewed by me and considered in my medical decision making (see chart for details).        Patient nontoxic no acute distress.  No concerns for sepsis.  Patient was well until today when she started with flulike symptoms temp at home 100 cough sore throat and body aches.  Patient had influenza B earlier in the year.  She also did have the flu shot.  Patient felt fine yesterday.  No unusual travel or coronavirus exposure.  Patient in the window for Tamiflu so given first dose of Tamiflu here will be continued on Tamiflu.  Otherwise symptomatic treatment she will return for any new or worse symptoms.  Final Clinical Impressions(s) / ED Diagnoses   Final diagnoses:  Influenza-like illness    ED Discharge Orders         Ordered    oseltamivir (TAMIFLU) 75 MG capsule  Every 12 hours     04/20/18 2308           Fredia Sorrow, MD 04/21/18 1554

## 2018-04-20 NOTE — Discharge Instructions (Addendum)
Return for any new or worse symptoms.  Particular return for increased difficulty with breathing.  Illness consistent with a flulike illness.  Take the Tamiflu as directed.  Over-the-counter Mucinex DM over-the-counter Motrin recommended for symptomatic treatment.  Work on Arboriculturist.

## 2018-04-20 NOTE — ED Notes (Signed)
ED Provider at bedside. 

## 2018-04-24 ENCOUNTER — Ambulatory Visit: Payer: 59 | Admitting: Family Medicine

## 2018-04-30 DIAGNOSIS — J069 Acute upper respiratory infection, unspecified: Secondary | ICD-10-CM | POA: Diagnosis not present

## 2018-09-11 IMAGING — DX DG CERVICAL SPINE 2 OR 3 VIEWS
4 series · 4 of 4 positions shown · non-contrast
Comparison: None.

CLINICAL DATA: Neck pain for 1 week.  No known injury.

EXAM:
CERVICAL SPINE - 2-3 VIEW

[dg cervical spine 2 or 3 views (1 of 4)]
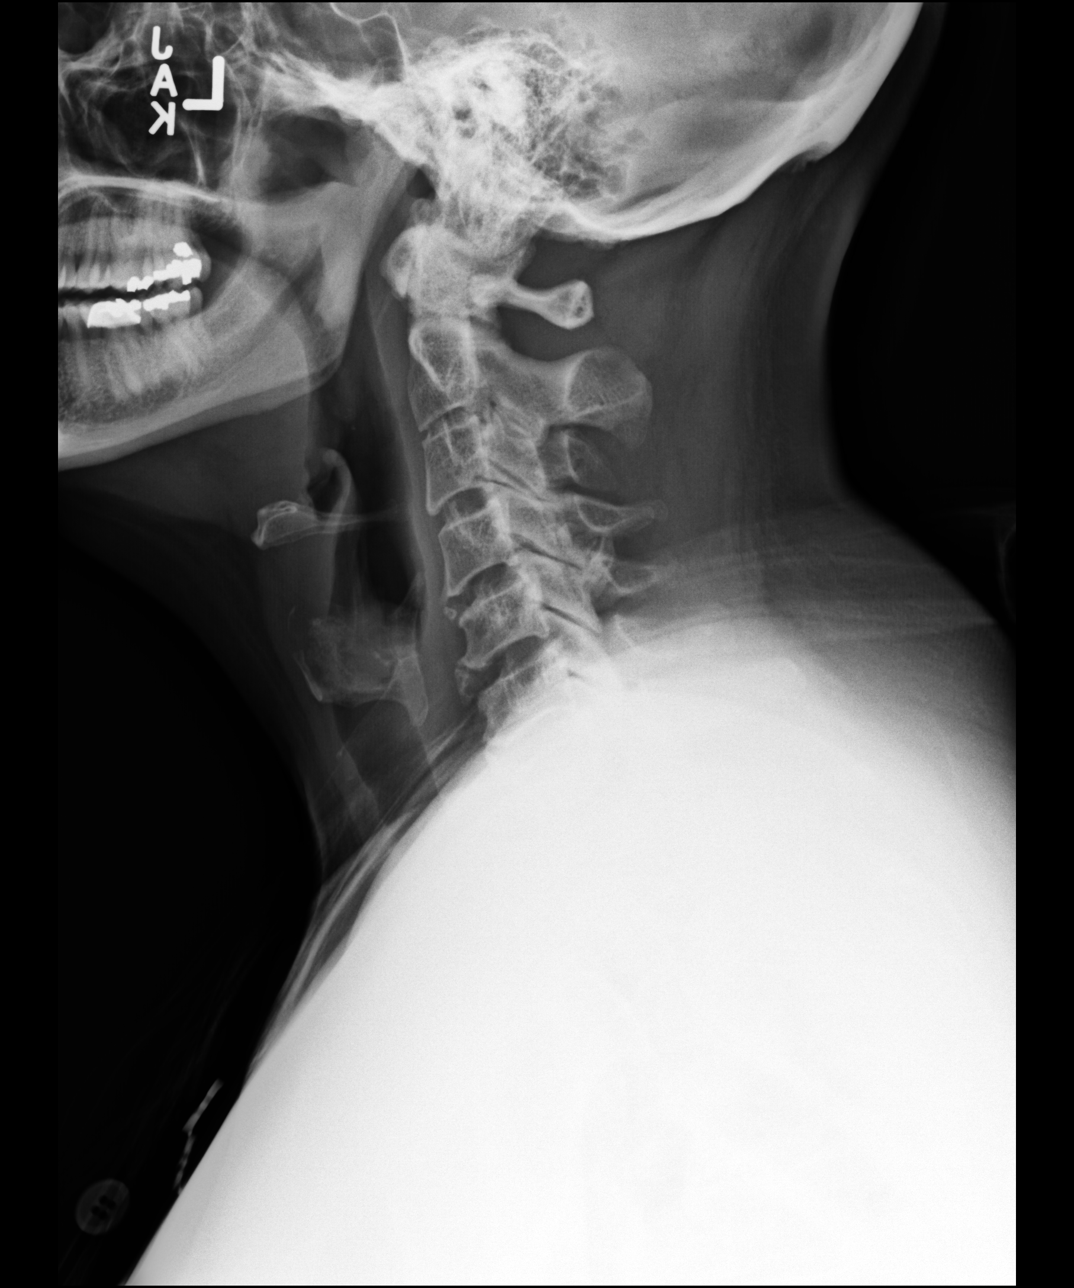

[dg cervical spine 2 or 3 views (2 of 4)]
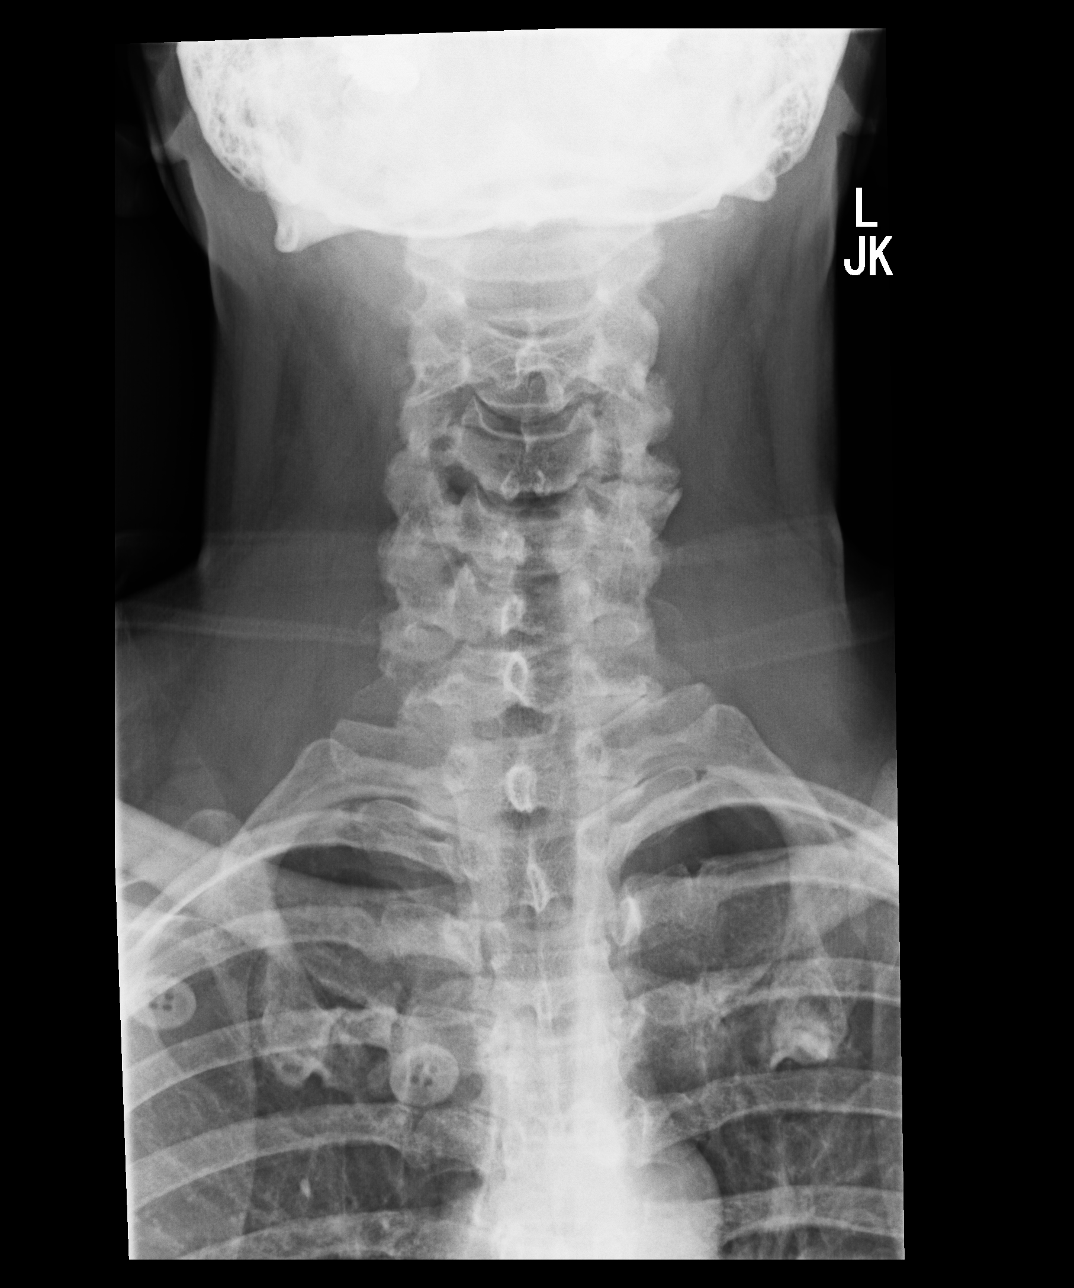

[dg cervical spine 2 or 3 views (3 of 4)]
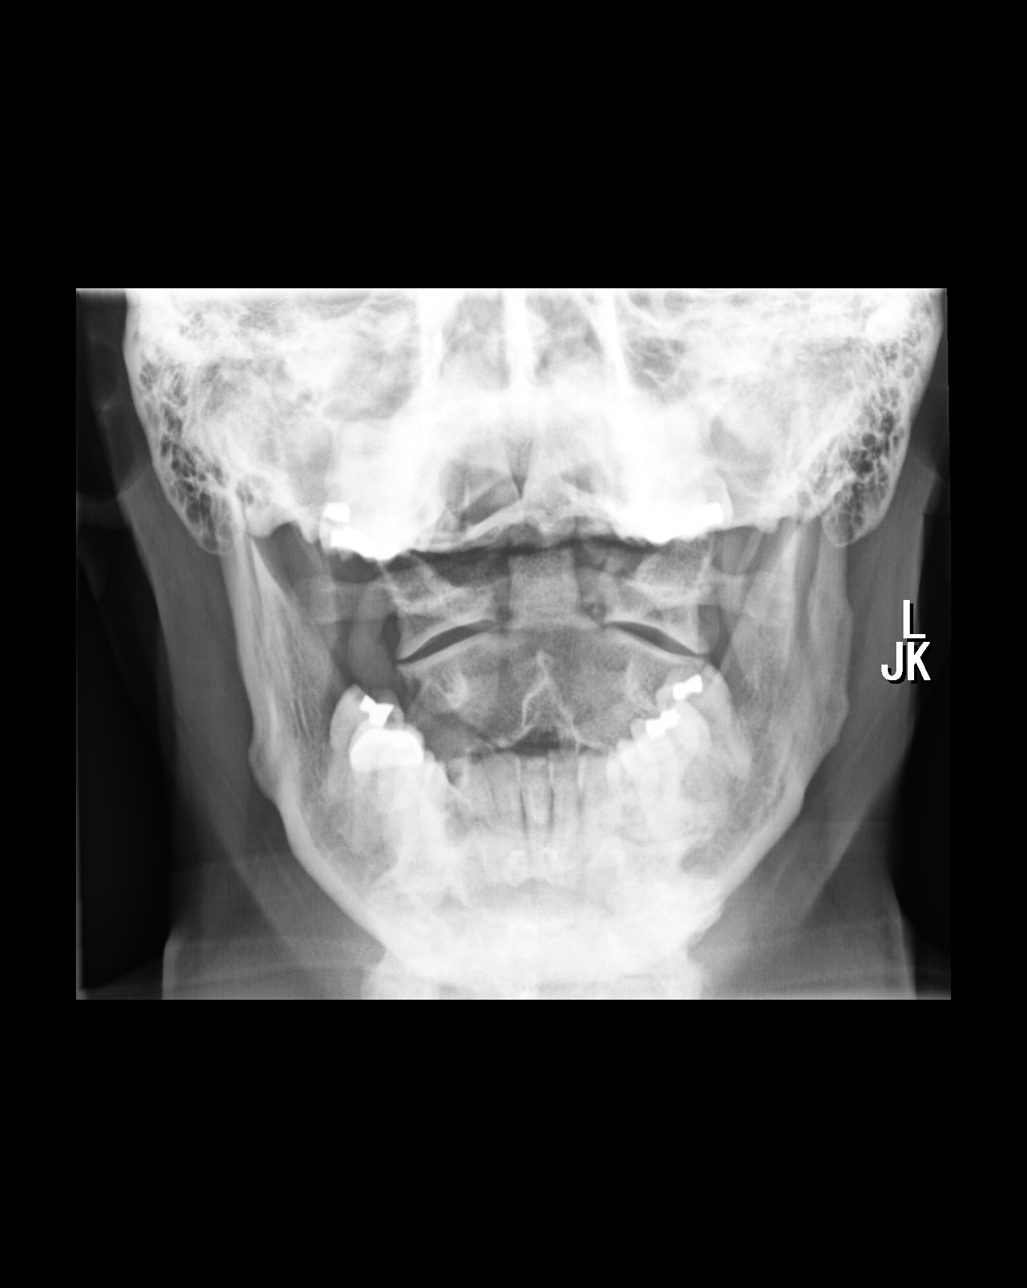

[dg cervical spine 2 or 3 views (4 of 4)]
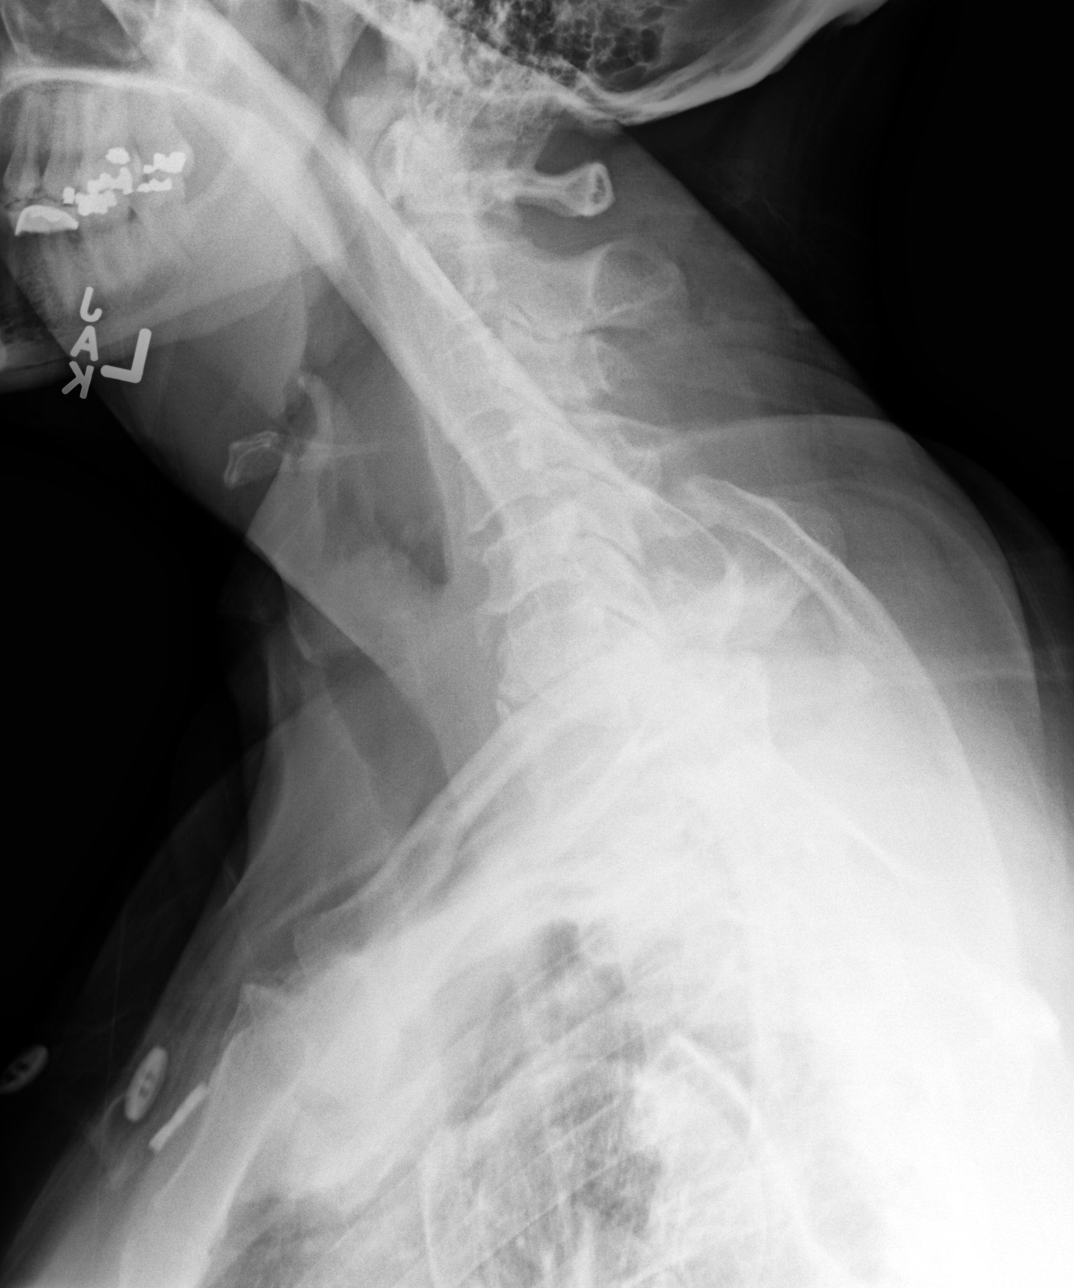

[4 of 4 positions shown; findings below may reference images not displayed]

FINDINGS: Vertebral body height is maintained. There is straightening of the
normal cervical lordosis. Scattered facet and uncovertebral disease
is seen. Intervertebral disc space height is maintained.
Prevertebral soft tissues appear normal. Lung apices are clear.
IMPRESSION: Scattered facet and uncovertebral degenerative change. The
examination is otherwise negative.

## 2018-12-30 ENCOUNTER — Other Ambulatory Visit: Payer: Self-pay

## 2018-12-30 ENCOUNTER — Ambulatory Visit: Payer: 59 | Admitting: Family Medicine

## 2018-12-30 VITALS — BP 116/72 | Ht 66.0 in | Wt 195.0 lb

## 2018-12-30 DIAGNOSIS — G8929 Other chronic pain: Secondary | ICD-10-CM

## 2018-12-30 DIAGNOSIS — M25562 Pain in left knee: Secondary | ICD-10-CM | POA: Diagnosis not present

## 2018-12-30 MED ORDER — METHYLPREDNISOLONE ACETATE 40 MG/ML IJ SUSP
40.0000 mg | Freq: Once | INTRAMUSCULAR | Status: AC
  Administered 2018-12-30: 40 mg via INTRA_ARTICULAR

## 2018-12-30 NOTE — Patient Instructions (Signed)

## 2018-12-31 ENCOUNTER — Encounter: Payer: Self-pay | Admitting: Family Medicine

## 2018-12-31 NOTE — Progress Notes (Signed)
PCP: System, Pcp Not In  Subjective:   HPI: Patient is a 53 y.o. female here for left knee pain.  Patient reports she's had problems with her knees for several years. About 5 years ago she had arthroscopy of both knees with partial meniscectomies.  Over past 2-3 weeks she's done more walking and noticed worsening pain in left knee anteriorly that radiates into left shin. Tried otc ibuprofen, icing, brace with only mild relief. No catching, locking. No skin changes, swelling.  Past Medical History:  Diagnosis Date  . Acute meniscal tear of left knee   . Anxiety   . Arthritis    knees and lower back  . Depression   . GERD (gastroesophageal reflux disease)     Current Outpatient Medications on File Prior to Visit  Medication Sig Dispense Refill  . atorvastatin (LIPITOR) 10 MG tablet Take 10 mg by mouth daily.    Marland Kitchen buPROPion (WELLBUTRIN XL) 150 MG 24 hr tablet Take 450 mg by mouth daily.    . citalopram (CELEXA) 40 MG tablet     . dexlansoprazole (DEXILANT) 60 MG capsule Take 60 mg by mouth daily.    . diazepam (VALIUM) 2 MG tablet Take 2 mg by mouth 3 (three) times daily.    Marland Kitchen estrogens-methylTEST (ESTRATEST) 1.25-2.5 MG tablet Take 1 tablet by mouth daily.     No current facility-administered medications on file prior to visit.     Past Surgical History:  Procedure Laterality Date  . ABDOMINAL HYSTERECTOMY  2000   and unilateral salpingoophorectomy/   oher ovary removed in 2001  . ABDOMINAL HYSTERECTOMY    . BREAST REDUCTION SURGERY  2005   and Liposuction of abdomen  . BREAST SURGERY    . COLONOSCOPY W/ POLYPECTOMY    . KNEE ARTHROSCOPY WITH MEDIAL MENISECTOMY Right 06/12/2012   Procedure: RIGHT KNEE ARTHROSCOPY WITH PARTIAL MEDIAL MENISCECTOMY And chondroplasty;  Surgeon: Magnus Sinning, MD;  Location: [facility];  Service: Orthopedics;  Laterality: Right;  . KNEE ARTHROSCOPY WITH MEDIAL MENISECTOMY Left 08/20/2012   Procedure: LEFT KNEE ARTHROSCOPY WITH MEDIAL  MENISCECTOMY,ABRASION CHRONDOPLASTY OF MEDIAL FEMORAL CONDYLE AND PATELLA AND MICROFRACTURE TECHNIQUE OF MEDIAL FEMORAL CONDYLE DIAGNOSTIC ARTHROSCOPY;  Surgeon: Tobi Bastos, MD;  Location: [place];  Service: Orthopedics;  Laterality: Left;  . KNEE SURGERY Bilateral   . REDUCTION MAMMAPLASTY Bilateral   . RHINOPLASTY  1999  . RHINOPLASTY    . STERIOD INJECTION Left 06/12/2012   Procedure: CORTISONE INJECTION TO LEFT KNEE;  Surgeon: Magnus Sinning, MD;  Location[facility];  Service: Orthopedics;  Laterality: Left;    Allergies  Allergen Reactions  . Codeine Nausea And Vomiting  . Codeine Nausea Only  . Sulfa Antibiotics     unknown  . Sulfa Antibiotics Rash    Social History   Socioeconomic History  . Marital status: Married    Spouse name: Not on file  . Number of children: Not on file  . Years of education: Not on file  . Highest education level: Not on file  Occupational History  . Not on file  Social Needs  . Financial resource strain: Not on file  . Food insecurity    Worry: Not on file    Inability: Not on file  . Transportation needs    Medical: Not on file    Non-medical: Not on file  Tobacco Use  . Smoking status: Former Smoker    Quit date: 06/04/2002    Years since quitting:  16.5  . Smokeless tobacco: Never Used  Substance and Sexual Activity  . Alcohol use: Yes    Comment: 1 drink/day  . Drug use: Never  . Sexual activity: Yes  Lifestyle  . Physical activity    Days per week: Not on file    Minutes per session: Not on file  . Stress: Not on file  Relationships  . Social Herbalist on phone: Not on file    Gets together: Not on file    Attends religious service: Not on file    Active member of club or organization: Not on file    Attends meetings of clubs or organizations: Not on file    Relationship status: Not on file  . Intimate partner violence    Fear of current or ex partner: Not on file    Emotionally  abused: Not on file    Physically abused: Not on file    Forced sexual activity: Not on file  Other Topics Concern  . Not on file  Social History Narrative   ** Merged History Encounter **        Family History  Problem Relation Age of Onset  . Diabetes Father   . Cancer Maternal Grandmother        brest     BP 116/72   Ht 5\' 6"  (1.676 m)   Wt 195 lb (88.5 kg)   BMI 31.47 kg/m   Review of Systems: See HPI above.     Objective:  Physical Exam:  Gen: NAD, comfortable in exam room  Left knee: No gross deformity, ecchymoses.  Mild effusion. TTP medial joint line.  No other tenderness. FROM with 5/5 strength flexion and extension. Negative ant/post drawers. Negative valgus/varus testing. Negative lachmans. Negative mcmurrays, apleys, patellar apprehension. NV intact distally.  Right knee: No gross deformity, ecchymoses, effusion. Mild TTP medial joint line. FROM with 5/5 strength flexion and extension. Negative ant/post drawers. Negative valgus/varus testing. Negative lachmans. NV intact distally.   Assessment & Plan:  1. Left knee pain - radiographs from 6 years ago showed evidence of arthropathy.  Patient's history and exam consistent with this as cause of her pain.  Exam otherwise reassuring.  Tylenol, topical medications, aleve, supplements reviewed.  Injection given today.  Shown home exercises to do daily.  F/u in 1 month.  After informed written consent timeout was performed, patient was seated on exam table. Left knee was prepped with alcohol swab and utilizing anteromedial approach, patient's left knee was injected intraarticularly with 3:1 bupivicaine: depomedrol. Patient tolerated the procedure well without immediate complications.

## 2019-01-03 ENCOUNTER — Other Ambulatory Visit: Payer: Self-pay | Admitting: Internal Medicine

## 2019-01-03 DIAGNOSIS — Z1231 Encounter for screening mammogram for malignant neoplasm of breast: Secondary | ICD-10-CM

## 2019-02-25 ENCOUNTER — Ambulatory Visit
Admission: RE | Admit: 2019-02-25 | Discharge: 2019-02-25 | Disposition: A | Discharge status: Home / Self Care | Payer: 59 | Source: Ambulatory Visit | Attending: Internal Medicine | Admitting: Internal Medicine

## 2019-02-25 ENCOUNTER — Other Ambulatory Visit: Payer: Self-pay

## 2019-02-25 DIAGNOSIS — Z1231 Encounter for screening mammogram for malignant neoplasm of breast: Secondary | ICD-10-CM

## 2019-04-01 ENCOUNTER — Other Ambulatory Visit: Payer: Self-pay | Admitting: Family

## 2019-04-01 DIAGNOSIS — Z526 Liver donor: Secondary | ICD-10-CM

## 2019-04-04 ENCOUNTER — Ambulatory Visit: Payer: 59 | Attending: Internal Medicine

## 2019-04-04 DIAGNOSIS — Z23 Encounter for immunization: Secondary | ICD-10-CM

## 2019-04-04 NOTE — Progress Notes (Signed)
   Covid-19 Vaccination Clinic  Name:  Ana Carter    MRN: [id number] DOB: February 01, 1966  04/04/2019  Ms. Eitzen was observed post Covid-19 immunization for 30 minutes based on pre-vaccination screening without incidence. She was provided with Vaccine Information Sheet and instruction to access the V-Safe system.   Ms. Petrasek was instructed to call 911 with any severe reactions post vaccine: Marland Kitchen Difficulty breathing  . Swelling of your face and throat  . A fast heartbeat  . A bad rash all over your body  . Dizziness and weakness    Immunizations Administered    Name Date Dose VIS Date Route   Pfizer COVID-19 Vaccine 04/04/2019 12:55 PM 0.3 mL 01/24/2019 Intramuscular   Manufacturer: [place]   Lot: [id number]   [place]: [id number]

## 2019-04-16 ENCOUNTER — Other Ambulatory Visit: Payer: Self-pay | Admitting: Family

## 2019-04-16 ENCOUNTER — Other Ambulatory Visit: Payer: Self-pay

## 2019-04-16 ENCOUNTER — Ambulatory Visit (HOSPITAL_COMMUNITY)
Admission: RE | Admit: 2019-04-16 | Discharge: 2019-04-16 | Disposition: A | Discharge status: Home / Self Care | Payer: Self-pay | Source: Ambulatory Visit | Attending: Family | Admitting: Family

## 2019-04-16 DIAGNOSIS — Z526 Liver donor: Secondary | ICD-10-CM

## 2019-04-16 MED ORDER — SODIUM CHLORIDE (PF) 0.9 % IJ SOLN
INTRAMUSCULAR | Status: AC
  Filled 2019-04-16: qty 50

## 2019-04-16 MED ORDER — IOHEXOL 300 MG/ML  SOLN
100.0000 mL | Freq: Once | INTRAMUSCULAR | Status: AC | PRN
  Administered 2019-04-16: 100 mL via INTRAVENOUS

## 2019-04-29 ENCOUNTER — Ambulatory Visit: Payer: 59 | Attending: Internal Medicine

## 2019-04-29 DIAGNOSIS — Z23 Encounter for immunization: Secondary | ICD-10-CM

## 2019-04-29 NOTE — Progress Notes (Signed)
   Covid-19 Vaccination Clinic  Name:  Ana Carter    MRN: [id number] DOB: [birth year]  04/29/2019  Ana Carter was observed post Covid-19 immunization for 15 minutes without incident. She was provided with Vaccine Information Sheet and instruction to access the V-Safe system.   Ana Carter was instructed to call 911 with any severe reactions post vaccine: Marland Kitchen Difficulty breathing  . Swelling of face and throat  . A fast heartbeat  . A bad rash all over body  . Dizziness and weakness   Immunizations Administered    Name Date Dose VIS Date Route   Pfizer COVID-19 Vaccine 04/29/2019 11:27 AM 0.3 mL 01/24/2019 Intramuscular   Manufacturer: [place]   Lot: [id number]   [place]: [id number]

## 2019-05-05 ENCOUNTER — Telehealth: Payer: Self-pay | Admitting: Physician Assistant

## 2019-05-05 NOTE — Telephone Encounter (Signed)
Patient left message requesting advice on medication prescribed at last appointment. Chart [phone]

## 2019-05-05 NOTE — Telephone Encounter (Signed)
LEFT MESSAGE FOR PATIENT TO CALL OFFICE SHE IS WANTING ADVICE ON MEDICATIONS GIVEN AT LAST VISIT

## 2019-05-12 NOTE — Telephone Encounter (Signed)
Phone call to patient because she has questions about her medications from her last with [facility].  Discussed with her that the Mupirocin was any open wounds that she may have, the TAC is for her legs/arms and back, the Alclometasone is for the "sore" by her right outer mouth, Ketoconazole and Diflucan is for the TV on her abdomen.  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  Her doctor wanted to know if this was related to her TV.  Told her that I would have to get with [facility] to see if there is any relation.

## 2019-07-30 ENCOUNTER — Ambulatory Visit
Admission: RE | Admit: 2019-07-30 | Discharge: 2019-07-30 | Disposition: A | Discharge status: Home / Self Care | Payer: 59 | Source: Ambulatory Visit | Attending: Family Medicine | Admitting: Family Medicine

## 2019-07-30 ENCOUNTER — Ambulatory Visit: Payer: 59 | Admitting: Family Medicine

## 2019-07-30 ENCOUNTER — Other Ambulatory Visit: Payer: Self-pay

## 2019-07-30 VITALS — BP 120/78 | Ht 66.0 in | Wt 190.0 lb

## 2019-07-30 DIAGNOSIS — M25561 Pain in right knee: Secondary | ICD-10-CM

## 2019-07-30 DIAGNOSIS — M25562 Pain in left knee: Secondary | ICD-10-CM

## 2019-07-30 DIAGNOSIS — M7712 Lateral epicondylitis, left elbow: Secondary | ICD-10-CM

## 2019-07-30 MED ORDER — METHYLPREDNISOLONE ACETATE 40 MG/ML IJ SUSP
40.0000 mg | Freq: Once | INTRAMUSCULAR | Status: AC
  Administered 2019-07-30: 40 mg via INTRA_ARTICULAR

## 2019-07-30 NOTE — Patient Instructions (Signed)
Your pain is due to arthritis. Get x-rays after you leave today if you can. These are the different medications you can take for this: Tylenol 500mg  1-2 tabs three times a day for pain. Capsaicin, aspercreme, or biofreeze topically up to four times a day may also help with pain. Some supplements that may help for arthritis: Boswellia extract, curcumin, pycnogenol Aleve 1-2 tabs twice a day with food Cortisone injections are an option - you were given these today. If cortisone injections do not help, there are different types of shots that may help but they take longer to take effect (gel shots). It's important that you continue to stay active. Straight leg raises, knee extensions 3 sets of 10 once a day (add ankle weight if these become too easy). Consider physical therapy to strengthen muscles around the joint that hurts to take pressure off of the joint itself. Shoe inserts with good arch support may be helpful. Heat or ice 15 minutes at a time 3-4 times a day as needed to help with pain. Water aerobics and cycling with low resistance are the best two types of exercise for arthritis though any exercise is ok as long as it doesn't worsen the pain.  You have lateral epicondylitis Try to avoid painful activities as much as possible. Ice the area 3-4 times a day for 15 minutes at a time. Tylenol or aleve as needed for pain. Counterforce brace as directed can help unload area - wear this regularly if it provides you with relief. Hammer rotation exercise, wrist extension exercise with 1 pound weight - 3 sets of 10 once a day.   Stretching - hold for 20-30 seconds and repeat 3 times. Wrist brace will help as well. Consider physical therapy, injection, nitro patches if not improving. Follow up in 6 weeks.

## 2019-07-31 ENCOUNTER — Encounter: Payer: Self-pay | Admitting: Family Medicine

## 2019-07-31 NOTE — Progress Notes (Signed)
PCP: Leeroy Cha, MD  Subjective:   HPI: Patient is a 54 y.o. female here for bilateral knee and left elbow pain.  12/30/18: Patient reports she's had problems with her knees for several years. About 5 years ago she had arthroscopy of both knees with partial meniscectomies.  Over past 2-3 weeks she's done more walking and noticed worsening pain in left knee anteriorly that radiates into left shin. Tried otc ibuprofen, icing, brace with only mild relief. No catching, locking. No skin changes, swelling.  07/30/19: Patient reports for past several weeks she's had worsening bilateral knee pain. Feels more medially, noticed with walking and when on feet. Difficulty bending knees. Radiation into shins. Associated swelling of both knees. Left knee is worse than right. Also worse with stairs. Not taking any medications for this. Injection did help her though unsure for how long. Also reports lateral left elbow pain over past several weeks, similar to pain she's had right elbow.  Past Medical History:  Diagnosis Date   Acute meniscal tear of left knee    Anxiety    Arthritis    knees and lower back   Depression    GERD (gastroesophageal reflux disease)     Current Outpatient Medications on File Prior to Visit  Medication Sig Dispense Refill   atorvastatin (LIPITOR) 10 MG tablet Take 10 mg by mouth daily.     buPROPion (WELLBUTRIN XL) 150 MG 24 hr tablet Take 450 mg by mouth daily.     citalopram (CELEXA) 40 MG tablet      dexlansoprazole (DEXILANT) 60 MG capsule Take 60 mg by mouth daily.     diazepam (VALIUM) 2 MG tablet Take 2 mg by mouth 3 (three) times daily.     estrogens-methylTEST (ESTRATEST) 1.25-2.5 MG tablet Take 1 tablet by mouth daily.     lamoTRIgine (LAMICTAL) 150 MG tablet Take 150 mg by mouth 2 (two) times daily.     No current facility-administered medications on file prior to visit.    Past Surgical History:  Procedure Laterality Date    ABDOMINAL HYSTERECTOMY  2000   and unilateral salpingoophorectomy/   oher ovary removed in 2001   [place]  2005   and Liposuction of abdomen   BREAST SURGERY     COLONOSCOPY W/ POLYPECTOMY     KNEE ARTHROSCOPY WITH MEDIAL MENISECTOMY Right 06/12/2012   Procedure: RIGHT KNEE ARTHROSCOPY WITH PARTIAL MEDIAL MENISCECTOMY And chondroplasty;  Surgeon: Magnus Sinning, MD;  Location: [facility];  Service: Orthopedics;  Laterality: Right;   KNEE ARTHROSCOPY WITH MEDIAL MENISECTOMY Left 08/20/2012   Procedure: LEFT KNEE ARTHROSCOPY WITH MEDIAL MENISCECTOMY,ABRASION CHRONDOPLASTY OF MEDIAL FEMORAL CONDYLE AND PATELLA AND MICROFRACTURE TECHNIQUE OF MEDIAL FEMORAL CONDYLE DIAGNOSTIC ARTHROSCOPY;  Surgeon: Tobi Bastos, MD;  Location: [facility];  Service: Orthopedics;  Laterality: Left;   KNEE SURGERY Bilateral    REDUCTION MAMMAPLASTY Bilateral    RHINOPLASTY  1999   RHINOPLASTY     STERIOD INJECTION Left 06/12/2012   Procedure: CORTISONE INJECTION TO LEFT KNEE;  Surgeon: Magnus Sinning, MD;  Location: [facility];  Service: Orthopedics;  Laterality: Left;    Allergies  Allergen Reactions   Codeine Nausea And Vomiting   Codeine Nausea Only   Sulfa Antibiotics     unknown   Sulfa Antibiotics Rash    Social History   Socioeconomic History   Marital status: Married    Spouse name: Not on file   Number  of children: Not on file   Years of education: Not on file   Highest education level: Not on file  Occupational History   Not on file  Tobacco Use   Smoking status: Former Smoker    Quit date: 06/04/2002    Years since quitting: 17.1   Smokeless tobacco: Never Used  Vaping Use   Vaping Use: Never used  Substance and Sexual Activity   Alcohol use: Yes    Comment: 1 drink/day   Drug use: Never   Sexual activity: Yes  Other Topics Concern   Not on file  Social History Narrative   ** Merged History  Encounter **       Social Determinants of Health   Financial Resource Strain:    Difficulty of Paying Living Expenses:   Food Insecurity:    Worried About Charity fundraiser in the Last Year:    Arboriculturist in the Last Year:   Transportation Needs:    Film/video editor (Medical):    Lack of Transportation (Non-Medical):   Physical Activity:    Days of Exercise per Week:    Minutes of Exercise per Session:   Stress:    Feeling of Stress :   Social Connections:    Frequency of Communication with Friends and Family:    Frequency of Social Gatherings with Friends and Family:    Attends Religious Services:    Active Member of Clubs or Organizations:    Attends Music therapist:    Marital Status:   Intimate Partner Violence:    Fear of Current or Ex-Partner:    Emotionally Abused:    Physically Abused:    Sexually Abused:     Family History  Problem Relation Age of Onset   Diabetes Father    Cancer Maternal Grandmother        brest     BP 120/78    Ht 5\' 6"  (1.676 m)    Wt 190 lb (86.2 kg)    BMI 30.67 kg/m   Review of Systems: See HPI above.     Objective:  Physical Exam:  Gen: NAD, comfortable in exam room  Left knee: No gross deformity, ecchymoses, swelling. TTP medial joint line.  No other tenderness. FROM with 5/5 strength flexion and extension. Negative ant/post drawers. Negative valgus/varus testing. Negative lachmans. Negative mcmurrays, apleys, patellar apprehension. NV intact distally.  Right knee: No gross deformity, ecchymoses, swelling. TTP medial joint line.  No other tenderness. FROM with 5/5 strength flexion and extension. Negative ant/post drawers. Negative valgus/varus testing. Negative lachmans. Negative mcmurrays, apleys, patellar apprehension. NV intact distally.  Left elbow: No deformity, swelling. FROM with 5/5 strength.  Pain resisted wrist extension.  No pain resisted 3rd digit  extension, supination. TTP lateral epicondyle.  No other tenderness. NVI distally.  Assessment & Plan:  1. Bilateral knee pain - history, exam, prior radiographs consistent with arthropathy - new radiographs ordered today to assess since prior ones about 17-52 years old.  Bilateral steroid injections given today.  Discussed quad strengthening, tylenol, topical medications, aleve, supplements that may help.  Consider physical therapy.  Heat or ice.    After informed written consent timeout was performed, patient was seated on exam table. Right knee was prepped with alcohol swab and utilizing anteromedial approach, patient's right knee was injected intraarticularly with 3:1 bupivicaine: depomedrol. Patient tolerated the procedure well without immediate complications.  After informed written consent timeout was performed, patient was seated on exam  table. Left knee was prepped with alcohol swab and utilizing anteromedial approach, patient's left knee was injected intraarticularly with 3:1 bupivicaine: depomedrol. Patient tolerated the procedure well without immediate complications.  2. Left lateral epicondylitis - shown home exercises to do daily.  Tylenol, aleve if needed.  Wrist brace, counterforce brace.  Consider physical therapy, injection, nitro patches.  F/u in 6 weeks.

## 2019-08-25 ENCOUNTER — Encounter: Payer: Self-pay | Admitting: Physician Assistant

## 2019-08-25 ENCOUNTER — Other Ambulatory Visit: Payer: Self-pay

## 2019-08-25 ENCOUNTER — Ambulatory Visit: Payer: 59 | Admitting: Physician Assistant

## 2019-08-25 DIAGNOSIS — D485 Neoplasm of uncertain behavior of skin: Secondary | ICD-10-CM | POA: Diagnosis not present

## 2019-08-25 DIAGNOSIS — L573 Poikiloderma of Civatte: Secondary | ICD-10-CM

## 2019-08-25 DIAGNOSIS — D489 Neoplasm of uncertain behavior, unspecified: Secondary | ICD-10-CM

## 2019-08-25 NOTE — Progress Notes (Signed)
   Follow up Visit  Subjective  [name] is a 54 y.o. female who presents for the following: Rash (LEFT NECK RASH PER [place] IN THE PAST SUN DAMAGE NO [place], LEFT WRIST WART? ALMOST GONE X12 MONTHS).  Objective  Well appearing patient in no apparent distress; mood and affect are within normal limits.  A focused examination was performed including face, neck and left forearm. Relevant physical exam findings are noted in the Assessment and Plan.   Objective  Left Dorsal Hand: Warty papule left wrist     Objective  Left Anterior Neck, Left Posterior Mandible: Macular erythematous reticulated patch left jawline and neck.  Images    Assessment & Plan  Neoplasm of uncertain behavior (2) Left Anterior Neck  Left Dorsal Hand  Skin / nail biopsy Type of biopsy: tangential   Informed consent: discussed and consent obtained   Timeout: patient name, date of birth, surgical site, and procedure verified   Procedure prep:  Patient was prepped and draped in usual sterile fashion (Non sterile) Prep type:  Chlorhexidine Anesthesia: the lesion was anesthetized in a standard fashion   Anesthetic:  1% lidocaine w/ epinephrine 1-100,000 local infiltration Instrument used: flexible razor blade   Outcome: patient tolerated procedure well   Post-procedure details: wound care instructions given    Specimen 1 - Surgical pathology Differential Diagnosis:  Check Margins: No Warty papule left wrist  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

## 2019-08-25 NOTE — Patient Instructions (Addendum)

## 2019-08-26 ENCOUNTER — Encounter: Payer: Self-pay | Admitting: Physician Assistant

## 2019-08-26 ENCOUNTER — Telehealth: Payer: Self-pay

## 2019-08-26 NOTE — Telephone Encounter (Signed)
-----   Message from Arlyss Gandy, [place] sent at 08/26/2019 11:38 AM EDT ----- Recheck in 6-8 weeks with [facility] or I. She was Kelli's pt.

## 2019-08-26 NOTE — Telephone Encounter (Signed)
Phone call to patient with her pathology results. Voicemail left for patient to give the office a call back.  

## 2019-08-28 NOTE — Telephone Encounter (Signed)
Called again for results. Phone tag

## 2019-08-28 NOTE — Telephone Encounter (Signed)
Patient left message on office voice mail saying that she was returning call from someone at [facility].

## 2019-08-28 NOTE — Telephone Encounter (Signed)
Phone call to patient with her pathology results. Voicemail left for patient to give the office a call back.  

## 2019-08-28 NOTE — Telephone Encounter (Signed)
Phone call to patient with her pathology results. Patient aware of results.  

## 2019-09-17 ENCOUNTER — Ambulatory Visit: Payer: 59 | Admitting: Family Medicine

## 2019-10-15 ENCOUNTER — Ambulatory Visit: Payer: 59 | Admitting: Physician Assistant

## 2019-12-15 ENCOUNTER — Encounter: Payer: Self-pay | Admitting: Family Medicine

## 2019-12-15 ENCOUNTER — Other Ambulatory Visit: Payer: Self-pay

## 2019-12-15 ENCOUNTER — Ambulatory Visit: Payer: 59 | Admitting: Family Medicine

## 2019-12-15 VITALS — BP 110/78 | Ht 66.0 in | Wt 193.0 lb

## 2019-12-15 DIAGNOSIS — M25562 Pain in left knee: Secondary | ICD-10-CM | POA: Diagnosis not present

## 2019-12-15 DIAGNOSIS — M25561 Pain in right knee: Secondary | ICD-10-CM

## 2019-12-15 MED ORDER — METHYLPREDNISOLONE ACETATE 40 MG/ML IJ SUSP
40.0000 mg | Freq: Once | INTRAMUSCULAR | Status: AC
  Administered 2019-12-15: 40 mg via INTRA_ARTICULAR

## 2019-12-15 NOTE — Patient Instructions (Signed)
Your pain is due to arthritis. These are the different medications you can take for this: Tylenol 500mg  1-2 tabs three times a day for pain. Capsaicin, aspercreme, or biofreeze topically up to four times a day may also help with pain. Some supplements that may help for arthritis: Boswellia extract, curcumin, pycnogenol Aleve 1-2 tabs twice a day with food as needed. Cortisone injections are an option - you were given these today. We are looking into the gel shots for you also and will call you about these.

## 2019-12-15 NOTE — Progress Notes (Signed)
PCP: Leeroy Cha, MD  Subjective:   HPI: Patient is a 54 y.o. female here for bilateral knee and left elbow pain.  12/30/18: Patient reports she's had problems with her knees for several years. About 5 years ago she had arthroscopy of both knees with partial meniscectomies.  Over past 2-3 weeks she's done more walking and noticed worsening pain in left knee anteriorly that radiates into left shin. Tried otc ibuprofen, icing, brace with only mild relief. No catching, locking. No skin changes, swelling.  07/30/19: Patient reports for past several weeks she's had worsening bilateral knee pain. Feels more medially, noticed with walking and when on feet. Difficulty bending knees. Radiation into shins. Associated swelling of both knees. Left knee is worse than right. Also worse with stairs. Not taking any medications for this. Injection did help her though unsure for how long. Also reports lateral left elbow pain over past several weeks, similar to pain she's had right elbow.  11/1: Patient reports her knees feel about the same if not the left one is worse than last visit. Right knee pain is off and on. Left medial pain is constant and limiting ability to even do light housework. Can only tolerate about 45 minutes on her feet. Cortisone injections helped for only a few weeks.  Past Medical History:  Diagnosis Date  . Acute meniscal tear of left knee   . Anxiety   . Arthritis    knees and lower back  . Atypical mole 02/20/2013   Right Upper Thigh (moderate)  . Depression   . GERD (gastroesophageal reflux disease)     Current Outpatient Medications on File Prior to Visit  Medication Sig Dispense Refill  . atorvastatin (LIPITOR) 10 MG tablet Take 10 mg by mouth daily.    Marland Kitchen buPROPion (WELLBUTRIN XL) 150 MG 24 hr tablet Take 450 mg by mouth daily.    . citalopram (CELEXA) 40 MG tablet     . dexlansoprazole (DEXILANT) 60 MG capsule Take 60 mg by mouth daily.    .  diazepam (VALIUM) 2 MG tablet Take 2 mg by mouth 3 (three) times daily.    Marland Kitchen estrogens-methylTEST (ESTRATEST) 1.25-2.5 MG tablet Take 1 tablet by mouth daily.    Marland Kitchen lamoTRIgine (LAMICTAL) 150 MG tablet Take 150 mg by mouth 2 (two) times daily.     No current facility-administered medications on file prior to visit.    Past Surgical History:  Procedure Laterality Date  . ABDOMINAL HYSTERECTOMY  2000   and unilateral salpingoophorectomy/   oher ovary removed in 2001  . ABDOMINAL HYSTERECTOMY    . BREAST REDUCTION SURGERY  2005   and Liposuction of abdomen  . BREAST SURGERY    . COLONOSCOPY W/ POLYPECTOMY    . KNEE ARTHROSCOPY WITH MEDIAL MENISECTOMY Right 06/12/2012   Procedure: RIGHT KNEE ARTHROSCOPY WITH PARTIAL MEDIAL MENISCECTOMY And chondroplasty;  Surgeon: Magnus Sinning, MD;  Location: [facility];  Service: Orthopedics;  Laterality: Right;  . KNEE ARTHROSCOPY WITH MEDIAL MENISECTOMY Left 08/20/2012   Procedure: LEFT KNEE ARTHROSCOPY WITH MEDIAL MENISCECTOMY,ABRASION CHRONDOPLASTY OF MEDIAL FEMORAL CONDYLE AND PATELLA AND MICROFRACTURE TECHNIQUE OF MEDIAL FEMORAL CONDYLE DIAGNOSTIC ARTHROSCOPY;  Surgeon: Tobi Bastos, MD;  Location: [place];  Service: Orthopedics;  Laterality: Left;  . KNEE SURGERY Bilateral   . REDUCTION MAMMAPLASTY Bilateral   . RHINOPLASTY  1999  . RHINOPLASTY    . STERIOD INJECTION Left 06/12/2012   Procedure: CORTISONE INJECTION TO LEFT KNEE;  Surgeon: Laurice Record [place],  MD;  Location: [facility];  Service: Orthopedics;  Laterality: Left;    Allergies  Allergen Reactions  . Codeine Nausea And Vomiting  . Codeine Nausea Only  . Sulfa Antibiotics     unknown  . Sulfa Antibiotics Rash    Social History   Socioeconomic History  . Marital status: Married    Spouse name: Not on file  . Number of children: Not on file  . Years of education: Not on file  . Highest education level: Not on file  Occupational History  . Not on file  Tobacco  Use  . Smoking status: Former Smoker    Quit date: 06/04/2002    Years since quitting: 17.5  . Smokeless tobacco: Never Used  Vaping Use  . Vaping Use: Never used  Substance and Sexual Activity  . Alcohol use: Yes    Comment: 1 drink/day  . Drug use: Never  . Sexual activity: Yes  Other Topics Concern  . Not on file  Social History Narrative   ** Merged History Encounter **       Social Determinants of Health   Financial Resource Strain:   . Difficulty of Paying Living Expenses: Not on file  Food Insecurity:   . Worried About Charity fundraiser in the Last Year: Not on file  . Ran Out of Food in the Last Year: Not on file  Transportation Needs:   . Lack of Transportation (Medical): Not on file  . Lack of Transportation (Non-Medical): Not on file  Physical Activity:   . Days of Exercise per Week: Not on file  . Minutes of Exercise per Session: Not on file  Stress:   . Feeling of Stress : Not on file  Social Connections:   . Frequency of Communication with Friends and Family: Not on file  . Frequency of Social Gatherings with Friends and Family: Not on file  . Attends Religious Services: Not on file  . Active Member of Clubs or Organizations: Not on file  . Attends Archivist Meetings: Not on file  . Marital Status: Not on file  Intimate Partner Violence:   . Fear of Current or Ex-Partner: Not on file  . Emotionally Abused: Not on file  . Physically Abused: Not on file  . Sexually Abused: Not on file    Family History  Problem Relation Age of Onset  . Diabetes Father   . Cancer Maternal Grandmother        brest     BP 110/78   Ht 5\' 6"  (1.676 m)   Wt 193 lb (87.5 kg)   BMI 31.15 kg/m   Review of Systems: See HPI above.     Objective:  Physical Exam:  Gen: NAD, comfortable in exam room  Left knee: No gross deformity, ecchymoses.  Minimal effusion. TTP medial joint line.  No other tenderness. FROM. Negative ant/post drawers. Negative  valgus/varus testing. Negative lachmans. Negative mcmurrays, apleys, patellar apprehension. NV intact distally.  Assessment & Plan:  1. Bilateral knee pain - 2/2 moderate arthropathy.  Repeated steroid injections today with superolateral approach.  Quad strengthening, otc medications reviewed.  Will look into visco injections.  Consider ortho referral if fails all conservative measures.  After informed written consent timeout was performed, patient was lying supine on exam table. Right knee was prepped with alcohol swab and utilizing superolateral approach with ultrasound guidance, patient's right knee was injected intraarticularly with 3:1 bupivicaine: depomedrol. Patient tolerated the procedure well without immediate complications.  After informed written consent timeout was performed, patient was lying supine on exam table. Left knee was prepped with alcohol swab and utilizing superolateral approach with ultrasound guidance, patient's left knee was injected intraarticularly with 3:1 bupivicaine: depomedrol. Patient tolerated the procedure well without immediate complications.

## 2020-01-12 ENCOUNTER — Other Ambulatory Visit: Payer: Self-pay

## 2020-01-12 ENCOUNTER — Encounter: Payer: Self-pay | Admitting: Family Medicine

## 2020-01-12 ENCOUNTER — Ambulatory Visit: Payer: 59 | Admitting: Family Medicine

## 2020-01-12 DIAGNOSIS — M170 Bilateral primary osteoarthritis of knee: Secondary | ICD-10-CM

## 2020-01-12 NOTE — Progress Notes (Signed)
PCP: Leeroy Cha, MD  Subjective:   HPI: Patient is a 54 y.o. female here for bilateral knee pain.  12/30/18: Patient reports she's had problems with her knees for several years. About 5 years ago she had arthroscopy of both knees with partial meniscectomies.  Over past 2-3 weeks she's done more walking and noticed worsening pain in left knee anteriorly that radiates into left shin. Tried otc ibuprofen, icing, brace with only mild relief. No catching, locking. No skin changes, swelling.  07/30/19: Patient reports for past several weeks she's had worsening bilateral knee pain. Feels more medially, noticed with walking and when on feet. Difficulty bending knees. Radiation into shins. Associated swelling of both knees. Left knee is worse than right. Also worse with stairs. Not taking any medications for this. Injection did help her though unsure for how long. Also reports lateral left elbow pain over past several weeks, similar to pain she's had right elbow.  11/1: Patient reports her knees feel about the same if not the left one is worse than last visit. Right knee pain is off and on. Left medial pain is constant and limiting ability to even do light housework. Can only tolerate about 45 minutes on her feet. Cortisone injections helped for only a few weeks.  11/29: Patient returns to start viscosupplementation both knees. Overall doing well.  Past Medical History:  Diagnosis Date  . Acute meniscal tear of left knee   . Anxiety   . Arthritis    knees and lower back  . Atypical mole 02/20/2013   Right Upper Thigh (moderate)  . Depression   . GERD (gastroesophageal reflux disease)     Current Outpatient Medications on File Prior to Visit  Medication Sig Dispense Refill  . atorvastatin (LIPITOR) 10 MG tablet Take 10 mg by mouth daily.    Marland Kitchen buPROPion (WELLBUTRIN XL) 150 MG 24 hr tablet Take 450 mg by mouth daily.    . citalopram (CELEXA) 40 MG tablet     .  dexlansoprazole (DEXILANT) 60 MG capsule Take 60 mg by mouth daily.    . diazepam (VALIUM) 2 MG tablet Take 2 mg by mouth 3 (three) times daily.    Marland Kitchen estrogens-methylTEST (ESTRATEST) 1.25-2.5 MG tablet Take 1 tablet by mouth daily.    Marland Kitchen lamoTRIgine (LAMICTAL) 150 MG tablet Take 150 mg by mouth 2 (two) times daily.     No current facility-administered medications on file prior to visit.    Past Surgical History:  Procedure Laterality Date  . ABDOMINAL HYSTERECTOMY  2000   and unilateral salpingoophorectomy/   oher ovary removed in 2001  . ABDOMINAL HYSTERECTOMY    . BREAST REDUCTION SURGERY  2005   and Liposuction of abdomen  . BREAST SURGERY    . COLONOSCOPY W/ POLYPECTOMY    . KNEE ARTHROSCOPY WITH MEDIAL MENISECTOMY Right 06/12/2012   Procedure: RIGHT KNEE ARTHROSCOPY WITH PARTIAL MEDIAL MENISCECTOMY And chondroplasty;  Surgeon: Magnus Sinning, MD;  Location: [facility];  Service: Orthopedics;  Laterality: Right;  . KNEE ARTHROSCOPY WITH MEDIAL MENISECTOMY Left 08/20/2012   Procedure: LEFT KNEE ARTHROSCOPY WITH MEDIAL MENISCECTOMY,ABRASION CHRONDOPLASTY OF MEDIAL FEMORAL CONDYLE AND PATELLA AND MICROFRACTURE TECHNIQUE OF MEDIAL FEMORAL CONDYLE DIAGNOSTIC ARTHROSCOPY;  Surgeon: Tobi Bastos, MD;  Location: [facility];  Service: Orthopedics;  Laterality: Left;  . KNEE SURGERY Bilateral   . REDUCTION MAMMAPLASTY Bilateral   . RHINOPLASTY  1999  . RHINOPLASTY    . STERIOD INJECTION Left 06/12/2012   Procedure: CORTISONE  INJECTION TO LEFT KNEE;  Surgeon: Magnus Sinning, MD;  Location: [facility];  Service: Orthopedics;  Laterality: Left;    Allergies  Allergen Reactions  . Codeine Nausea And Vomiting  . Codeine Nausea Only  . Sulfa Antibiotics     unknown  . Sulfa Antibiotics Rash    Social History   Socioeconomic History  . Marital status: Married    Spouse name: Not on file  . Number of children: Not on file  . Years of education: Not on file  . Highest  education level: Not on file  Occupational History  . Not on file  Tobacco Use  . Smoking status: Former Smoker    Quit date: 06/04/2002    Years since quitting: 17.6  . Smokeless tobacco: Never Used  Vaping Use  . Vaping Use: Never used  Substance and Sexual Activity  . Alcohol use: Yes    Comment: 1 drink/day  . Drug use: Never  . Sexual activity: Yes  Other Topics Concern  . Not on file  Social History Narrative   ** Merged History Encounter **       Social Determinants of Health   Financial Resource Strain:   . Difficulty of Paying Living Expenses: Not on file  Food Insecurity:   . Worried About Charity fundraiser in the Last Year: Not on file  . Ran Out of Food in the Last Year: Not on file  Transportation Needs:   . Lack of Transportation (Medical): Not on file  . Lack of Transportation (Non-Medical): Not on file  Physical Activity:   . Days of Exercise per Week: Not on file  . Minutes of Exercise per Session: Not on file  Stress:   . Feeling of Stress : Not on file  Social Connections:   . Frequency of Communication with Friends and Family: Not on file  . Frequency of Social Gatherings with Friends and Family: Not on file  . Attends Religious Services: Not on file  . Active Member of Clubs or Organizations: Not on file  . Attends Archivist Meetings: Not on file  . Marital Status: Not on file  Intimate Partner Violence:   . Fear of Current or Ex-Partner: Not on file  . Emotionally Abused: Not on file  . Physically Abused: Not on file  . Sexually Abused: Not on file    Family History  Problem Relation Age of Onset  . Diabetes Father   . Cancer Maternal Grandmother        brest     BP 122/76   Ht 5\' 6"  (1.676 m)   Wt 185 lb (83.9 kg)   BMI 29.86 kg/m   Review of Systems: See HPI above.     Objective:  Physical Exam:  Gen: NAD, comfortable in exam room Rest of exam not repeated today.  Left knee: No gross deformity, ecchymoses.   Minimal effusion. TTP medial joint line.  No other tenderness. FROM. Negative ant/post drawers. Negative valgus/varus testing. Negative lachmans. Negative mcmurrays, apleys, patellar apprehension. NV intact distally.  Assessment & Plan:  1. Bilateral knee pain - 2/2 moderate arthropathy.  Viscosupplementation started today.  F/u in 1 week.  After informed written consent timeout was performed, patient was lying supine on exam table. Right knee was prepped with alcohol swab and utilizing superolateral approach with ultrasound guidance, patient's right knee was injected intraarticularly with 52mL lidocaine followed by gelsyn. Patient tolerated the procedure well without immediate complications.  After informed  written consent timeout was performed, patient was lying supine on exam table. Left knee was prepped with alcohol swab and utilizing superolateral approach with ultrasound guidance, patient's left knee was injected intraarticularly with 70mL lidocaine followed by gelsyn. Patient tolerated the procedure well without immediate complications.

## 2020-01-19 ENCOUNTER — Other Ambulatory Visit: Payer: Self-pay

## 2020-01-19 ENCOUNTER — Ambulatory Visit: Payer: 59 | Admitting: Family Medicine

## 2020-01-19 ENCOUNTER — Other Ambulatory Visit: Payer: Self-pay | Admitting: Internal Medicine

## 2020-01-19 DIAGNOSIS — M170 Bilateral primary osteoarthritis of knee: Secondary | ICD-10-CM

## 2020-01-19 DIAGNOSIS — Z1231 Encounter for screening mammogram for malignant neoplasm of breast: Secondary | ICD-10-CM

## 2020-01-20 ENCOUNTER — Encounter: Payer: Self-pay | Admitting: Family Medicine

## 2020-01-20 NOTE — Progress Notes (Signed)
PCP: Leeroy Cha, MD  Subjective:   HPI: Patient is a 54 y.o. female here for bilateral knee pain.  12/30/18: Patient reports she's had problems with her knees for several years. About 5 years ago she had arthroscopy of both knees with partial meniscectomies.  Over past 2-3 weeks she's done more walking and noticed worsening pain in left knee anteriorly that radiates into left shin. Tried otc ibuprofen, icing, brace with only mild relief. No catching, locking. No skin changes, swelling.  07/30/19: Patient reports for past several weeks she's had worsening bilateral knee pain. Feels more medially, noticed with walking and when on feet. Difficulty bending knees. Radiation into shins. Associated swelling of both knees. Left knee is worse than right. Also worse with stairs. Not taking any medications for this. Injection did help her though unsure for how long. Also reports lateral left elbow pain over past several weeks, similar to pain she's had right elbow.  11/1: Patient reports her knees feel about the same if not the left one is worse than last visit. Right knee pain is off and on. Left medial pain is constant and limiting ability to even do light housework. Can only tolerate about 45 minutes on her feet. Cortisone injections helped for only a few weeks.  11/29: Patient returns to start viscosupplementation both knees. Overall doing well.  12/6: Patient returns for second gelsyn injections. She's doing well, felt great after first injections.  Past Medical History:  Diagnosis Date  . Acute meniscal tear of left knee   . Anxiety   . Arthritis    knees and lower back  . Atypical mole 02/20/2013   Right Upper Thigh (moderate)  . Depression   . GERD (gastroesophageal reflux disease)     Current Outpatient Medications on File Prior to Visit  Medication Sig Dispense Refill  . atorvastatin (LIPITOR) 10 MG tablet Take 10 mg by mouth daily.    Marland Kitchen buPROPion  (WELLBUTRIN XL) 150 MG 24 hr tablet Take 450 mg by mouth daily.    . citalopram (CELEXA) 40 MG tablet     . dexlansoprazole (DEXILANT) 60 MG capsule Take 60 mg by mouth daily.    . diazepam (VALIUM) 2 MG tablet Take 2 mg by mouth 3 (three) times daily.    Marland Kitchen estrogens-methylTEST (ESTRATEST) 1.25-2.5 MG tablet Take 1 tablet by mouth daily.    Marland Kitchen lamoTRIgine (LAMICTAL) 150 MG tablet Take 150 mg by mouth 2 (two) times daily.     No current facility-administered medications on file prior to visit.    Past Surgical History:  Procedure Laterality Date  . ABDOMINAL HYSTERECTOMY  2000   and unilateral salpingoophorectomy/   oher ovary removed in 2001  . ABDOMINAL HYSTERECTOMY    . BREAST REDUCTION SURGERY  2005   and Liposuction of abdomen  . BREAST SURGERY    . COLONOSCOPY W/ POLYPECTOMY    . KNEE ARTHROSCOPY WITH MEDIAL MENISECTOMY Right 06/12/2012   Procedure: RIGHT KNEE ARTHROSCOPY WITH PARTIAL MEDIAL MENISCECTOMY And chondroplasty;  Surgeon: Magnus Sinning, MD;  Location: [facility];  Service: Orthopedics;  Laterality: Right;  . KNEE ARTHROSCOPY WITH MEDIAL MENISECTOMY Left 08/20/2012   Procedure: LEFT KNEE ARTHROSCOPY WITH MEDIAL MENISCECTOMY,ABRASION CHRONDOPLASTY OF MEDIAL FEMORAL CONDYLE AND PATELLA AND MICROFRACTURE TECHNIQUE OF MEDIAL FEMORAL CONDYLE DIAGNOSTIC ARTHROSCOPY;  Surgeon: Tobi Bastos, MD;  Location: [facility];  Service: Orthopedics;  Laterality: Left;  . KNEE SURGERY Bilateral   . REDUCTION MAMMAPLASTY Bilateral   . RHINOPLASTY  1999  . RHINOPLASTY    . STERIOD INJECTION Left 06/12/2012   Procedure: CORTISONE INJECTION TO LEFT KNEE;  Surgeon: Magnus Sinning, MD;  Location: [facility];  Service: Orthopedics;  Laterality: Left;    Allergies  Allergen Reactions  . Codeine Nausea And Vomiting  . Codeine Nausea Only  . Sulfa Antibiotics     unknown  . Sulfa Antibiotics Rash    Social History   Socioeconomic History  . Marital status: Married     Spouse name: Not on file  . Number of children: Not on file  . Years of education: Not on file  . Highest education level: Not on file  Occupational History  . Not on file  Tobacco Use  . Smoking status: Former Smoker    Quit date: 06/04/2002    Years since quitting: 17.6  . Smokeless tobacco: Never Used  Vaping Use  . Vaping Use: Never used  Substance and Sexual Activity  . Alcohol use: Yes    Comment: 1 drink/day  . Drug use: Never  . Sexual activity: Yes  Other Topics Concern  . Not on file  Social History Narrative   ** Merged History Encounter **       Social Determinants of Health   Financial Resource Strain:   . Difficulty of Paying Living Expenses: Not on file  Food Insecurity:   . Worried About Charity fundraiser in the Last Year: Not on file  . Ran Out of Food in the Last Year: Not on file  Transportation Needs:   . Lack of Transportation (Medical): Not on file  . Lack of Transportation (Non-Medical): Not on file  Physical Activity:   . Days of Exercise per Week: Not on file  . Minutes of Exercise per Session: Not on file  Stress:   . Feeling of Stress : Not on file  Social Connections:   . Frequency of Communication with Friends and Family: Not on file  . Frequency of Social Gatherings with Friends and Family: Not on file  . Attends Religious Services: Not on file  . Active Member of Clubs or Organizations: Not on file  . Attends Archivist Meetings: Not on file  . Marital Status: Not on file  Intimate Partner Violence:   . Fear of Current or Ex-Partner: Not on file  . Emotionally Abused: Not on file  . Physically Abused: Not on file  . Sexually Abused: Not on file    Family History  Problem Relation Age of Onset  . Diabetes Father   . Cancer Maternal Grandmother        brest     BP 114/78   Ht 5\' 6"  (1.676 m)   Wt 188 lb (85.3 kg)   BMI 30.34 kg/m   Review of Systems: See HPI above.     Objective:  Physical Exam:  Gen: NAD,  comfortable in exam room Rest of exam not repeated today.  Left knee: No gross deformity, ecchymoses.  Minimal effusion. TTP medial joint line.  No other tenderness. FROM. Negative ant/post drawers. Negative valgus/varus testing. Negative lachmans. Negative mcmurrays, apleys, patellar apprehension. NV intact distally.  Assessment & Plan:  1. Bilateral knee pain - 2/2 moderate arthropathy.  Second gelsyn injections given today.  F/u in 1 week.  After informed written consent timeout was performed, patient was lying supine. Right knee was prepped with alcohol swab and utilizing superolateral approach with ultrasound guidance, patient's right knee was injected intraarticularly with 49mL  lidocaine followed by gelsyn. Patient tolerated the procedure well without immediate complications.  After informed written consent timeout was performed, patient was lying supine. Left knee was prepped with alcohol swab and utilizing superolateral approach with ultrasound guidance, patient's left knee was injected intraarticularly with 14mL lidocaine followed by gelsyn. Patient tolerated the procedure well without immediate complications.

## 2020-01-26 ENCOUNTER — Ambulatory Visit: Payer: 59 | Admitting: Family Medicine

## 2020-01-26 ENCOUNTER — Other Ambulatory Visit: Payer: Self-pay

## 2020-01-26 ENCOUNTER — Encounter: Payer: Self-pay | Admitting: Family Medicine

## 2020-01-26 DIAGNOSIS — M170 Bilateral primary osteoarthritis of knee: Secondary | ICD-10-CM

## 2020-01-26 NOTE — Progress Notes (Signed)
PCP: Leeroy Cha, MD  Subjective:   HPI: Patient is a 54 y.o. female here for bilateral knee pain.  12/30/18: Patient reports she's had problems with her knees for several years. About 5 years ago she had arthroscopy of both knees with partial meniscectomies.  Over past 2-3 weeks she's done more walking and noticed worsening pain in left knee anteriorly that radiates into left shin. Tried otc ibuprofen, icing, brace with only mild relief. No catching, locking. No skin changes, swelling.  07/30/19: Patient reports for past several weeks she's had worsening bilateral knee pain. Feels more medially, noticed with walking and when on feet. Difficulty bending knees. Radiation into shins. Associated swelling of both knees. Left knee is worse than right. Also worse with stairs. Not taking any medications for this. Injection did help her though unsure for how long. Also reports lateral left elbow pain over past several weeks, similar to pain she's had right elbow.  11/1: Patient reports her knees feel about the same if not the left one is worse than last visit. Right knee pain is off and on. Left medial pain is constant and limiting ability to even do light housework. Can only tolerate about 45 minutes on her feet. Cortisone injections helped for only a few weeks.  11/29: Patient returns to start viscosupplementation both knees. Overall doing well.  12/6: Patient returns for second gelsyn injections. She's doing well, felt great after first injections.  12/13: Patient returns for third gelsyn injections. Doing well without complaints.  Past Medical History:  Diagnosis Date  . Acute meniscal tear of left knee   . Anxiety   . Arthritis    knees and lower back  . Atypical mole 02/20/2013   Right Upper Thigh (moderate)  . Depression   . GERD (gastroesophageal reflux disease)     Current Outpatient Medications on File Prior to Visit  Medication Sig Dispense  Refill  . atorvastatin (LIPITOR) 10 MG tablet Take 10 mg by mouth daily.    Marland Kitchen buPROPion (WELLBUTRIN XL) 150 MG 24 hr tablet Take 450 mg by mouth daily.    . citalopram (CELEXA) 40 MG tablet     . dexlansoprazole (DEXILANT) 60 MG capsule Take 60 mg by mouth daily.    . diazepam (VALIUM) 2 MG tablet Take 2 mg by mouth 3 (three) times daily.    Marland Kitchen estrogens-methylTEST (ESTRATEST) 1.25-2.5 MG tablet Take 1 tablet by mouth daily.    Marland Kitchen lamoTRIgine (LAMICTAL) 150 MG tablet Take 150 mg by mouth 2 (two) times daily.     No current facility-administered medications on file prior to visit.    Past Surgical History:  Procedure Laterality Date  . ABDOMINAL HYSTERECTOMY  2000   and unilateral salpingoophorectomy/   oher ovary removed in 2001  . ABDOMINAL HYSTERECTOMY    . BREAST REDUCTION SURGERY  2005   and Liposuction of abdomen  . BREAST SURGERY    . COLONOSCOPY W/ POLYPECTOMY    . KNEE ARTHROSCOPY WITH MEDIAL MENISECTOMY Right 06/12/2012   Procedure: RIGHT KNEE ARTHROSCOPY WITH PARTIAL MEDIAL MENISCECTOMY And chondroplasty;  Surgeon: Magnus Sinning, MD;  Location: [facility];  Service: Orthopedics;  Laterality: Right;  . KNEE ARTHROSCOPY WITH MEDIAL MENISECTOMY Left 08/20/2012   Procedure: LEFT KNEE ARTHROSCOPY WITH MEDIAL MENISCECTOMY,ABRASION CHRONDOPLASTY OF MEDIAL FEMORAL CONDYLE AND PATELLA AND MICROFRACTURE TECHNIQUE OF MEDIAL FEMORAL CONDYLE DIAGNOSTIC ARTHROSCOPY;  Surgeon: Tobi Bastos, MD;  Location: [facility];  Service: Orthopedics;  Laterality: Left;  . KNEE SURGERY  Bilateral   . REDUCTION MAMMAPLASTY Bilateral   . RHINOPLASTY  1999  . RHINOPLASTY    . STERIOD INJECTION Left 06/12/2012   Procedure: CORTISONE INJECTION TO LEFT KNEE;  Surgeon: Magnus Sinning, MD;  Location: [facility];  Service: Orthopedics;  Laterality: Left;    Allergies  Allergen Reactions  . Codeine Nausea And Vomiting  . Codeine Nausea Only  . Sulfa Antibiotics     unknown  . Sulfa  Antibiotics Rash    Social History   Socioeconomic History  . Marital status: Married    Spouse name: Not on file  . Number of children: Not on file  . Years of education: Not on file  . Highest education level: Not on file  Occupational History  . Not on file  Tobacco Use  . Smoking status: Former Smoker    Quit date: 06/04/2002    Years since quitting: 17.6  . Smokeless tobacco: Never Used  Vaping Use  . Vaping Use: Never used  Substance and Sexual Activity  . Alcohol use: Yes    Comment: 1 drink/day  . Drug use: Never  . Sexual activity: Yes  Other Topics Concern  . Not on file  Social History Narrative   ** Merged History Encounter **       Social Determinants of Health   Financial Resource Strain: Not on file  Food Insecurity: Not on file  Transportation Needs: Not on file  Physical Activity: Not on file  Stress: Not on file  Social Connections: Not on file  Intimate Partner Violence: Not on file    Family History  Problem Relation Age of Onset  . Diabetes Father   . Cancer Maternal Grandmother        brest     BP 110/70   Ht 5\' 7"  (1.702 m)   Wt 185 lb (83.9 kg)   BMI 28.98 kg/m   Review of Systems: See HPI above.     Objective:  Physical Exam:  Gen: NAD, comfortable in exam room  Rest of exam not repeated today.  Left knee: No gross deformity, ecchymoses.  Minimal effusion. TTP medial joint line.  No other tenderness. FROM. Negative ant/post drawers. Negative valgus/varus testing. Negative lachmans. Negative mcmurrays, apleys, patellar apprehension. NV intact distally.  Assessment & Plan:  1. Bilateral knee pain - 2/2 moderate arthropathy.  Third gelsyn injections given today.  F/u prn.  After informed written consent timeout was performed, patient was lying supine on exam table. Right knee was prepped with alcohol swab and utilizing superolateral approach with ultrasound guidance, patient's right knee was injected intraarticularly  with 22mL lidocaine followed by gelsyn.  Patient tolerated the procedure well without immediate complications.  After informed written consent timeout was performed, patient was lying supine on exam table. Left knee was prepped with alcohol swab and utilizing superolateral approach with ultrasound guidance, patient's left knee was injected intraarticularly with 64mL lidocaine followed by gelsyn.  Patient tolerated the procedure well without immediate complications.

## 2020-03-02 ENCOUNTER — Ambulatory Visit: Payer: 59

## 2020-03-30 ENCOUNTER — Inpatient Hospital Stay: Admission: RE | Admit: 2020-03-30 | Payer: 59 | Source: Ambulatory Visit

## 2020-05-18 ENCOUNTER — Inpatient Hospital Stay: Admission: RE | Admit: 2020-05-18 | Payer: 59 | Source: Ambulatory Visit

## 2020-07-07 ENCOUNTER — Ambulatory Visit: Payer: 59

## 2020-07-09 ENCOUNTER — Other Ambulatory Visit: Payer: Self-pay

## 2020-07-09 ENCOUNTER — Ambulatory Visit
Admission: RE | Admit: 2020-07-09 | Discharge: 2020-07-09 | Disposition: A | Discharge status: Home / Self Care | Payer: 59 | Source: Ambulatory Visit | Attending: Internal Medicine | Admitting: Internal Medicine

## 2020-07-09 DIAGNOSIS — Z1231 Encounter for screening mammogram for malignant neoplasm of breast: Secondary | ICD-10-CM

## 2020-08-11 ENCOUNTER — Other Ambulatory Visit: Payer: Self-pay

## 2020-08-11 ENCOUNTER — Ambulatory Visit: Payer: Self-pay

## 2020-08-11 ENCOUNTER — Ambulatory Visit: Payer: 59 | Admitting: Family Medicine

## 2020-08-11 VITALS — Ht 66.0 in | Wt 187.0 lb

## 2020-08-11 DIAGNOSIS — M25561 Pain in right knee: Secondary | ICD-10-CM | POA: Diagnosis not present

## 2020-08-11 MED ORDER — DICLOFENAC SODIUM 75 MG PO TBEC: 75.0000 mg | DELAYED_RELEASE_TABLET | Freq: Two times a day (BID) | ORAL | 1 refills | Status: AC

## 2020-08-11 NOTE — Patient Instructions (Addendum)
I'm concerned you have a bucket handle or flap-type medial meniscus tear. We will go ahead with an MRI of your knee to further assess. Icing or heat 15 minutes at a time 3-4 times a day. Knee brace or sleeve when up and walking around for support. Simple motion exercise as we discussed. Diclofenac 75mg  twice a day with food for pain and inflammation - don't take aleve or ibuprofen with this. I will call you with the results and next steps.

## 2020-08-12 ENCOUNTER — Encounter: Payer: Self-pay | Admitting: Family Medicine

## 2020-08-12 NOTE — Progress Notes (Signed)
PCP: Leeroy Cha, MD  Subjective:   HPI: Patient is a 55 y.o. female here for right knee pain.  Patient has known history of knee arthritis. Current pain initially started on Thursday where she reports she had a weird lateral pain that was just to the outside of the kneecap. This happened a few times but then on Friday morning the medial aspect of her knee was swollen and very painful, felt like it was catching and she could not bend it for 3 to 4 days. She has been using a brace, icing, taking ibuprofen. Pain was at its worst on Sunday that was still fairly severe. She is still having the catching and difficulty with full motion of the knee.  Past Medical History:  Diagnosis Date   Acute meniscal tear of left knee    Anxiety    Arthritis    knees and lower back   Atypical mole 02/20/2013   Right Upper Thigh (moderate)   Depression    GERD (gastroesophageal reflux disease)     Current Outpatient Medications on File Prior to Visit  Medication Sig Dispense Refill   atorvastatin (LIPITOR) 10 MG tablet Take 10 mg by mouth daily.     buPROPion (WELLBUTRIN XL) 150 MG 24 hr tablet Take 450 mg by mouth daily.     citalopram (CELEXA) 40 MG tablet      dexlansoprazole (DEXILANT) 60 MG capsule Take 60 mg by mouth daily.     estrogens-methylTEST (ESTRATEST) 1.25-2.5 MG tablet Take 1 tablet by mouth daily.     lamoTRIgine (LAMICTAL) 150 MG tablet Take 150 mg by mouth 2 (two) times daily.     No current facility-administered medications on file prior to visit.    Past Surgical History:  Procedure Laterality Date   ABDOMINAL HYSTERECTOMY  2000   and unilateral salpingoophorectomy/   oher ovary removed in 2001   [place]  2005   and Liposuction of abdomen   BREAST SURGERY     COLONOSCOPY W/ POLYPECTOMY     KNEE ARTHROSCOPY WITH MEDIAL MENISECTOMY Right 06/12/2012   Procedure: RIGHT KNEE ARTHROSCOPY WITH PARTIAL MEDIAL MENISCECTOMY  And chondroplasty;  Surgeon: Magnus Sinning, MD;  Location: [facility];  Service: Orthopedics;  Laterality: Right;   KNEE ARTHROSCOPY WITH MEDIAL MENISECTOMY Left 08/20/2012   Procedure: LEFT KNEE ARTHROSCOPY WITH MEDIAL MENISCECTOMY,ABRASION CHRONDOPLASTY OF MEDIAL FEMORAL CONDYLE AND PATELLA AND MICROFRACTURE TECHNIQUE OF MEDIAL FEMORAL CONDYLE DIAGNOSTIC ARTHROSCOPY;  Surgeon: Tobi Bastos, MD;  Location: [facility];  Service: Orthopedics;  Laterality: Left;   KNEE SURGERY Bilateral    REDUCTION MAMMAPLASTY Bilateral 2006   RHINOPLASTY  1999   RHINOPLASTY     STERIOD INJECTION Left 06/12/2012   Procedure: CORTISONE INJECTION TO LEFT KNEE;  Surgeon: Magnus Sinning, MD;  Location: [facility];  Service: Orthopedics;  Laterality: Left;    Allergies  Allergen Reactions   Codeine Nausea And Vomiting   Codeine Nausea Only   Sulfa Antibiotics     unknown   Sulfa Antibiotics Rash    Social History   Socioeconomic History   Marital status: Married    Spouse name: Not on file   Number of children: Not on file   Years of education: Not on file   Highest education level: Not on file  Occupational History   Not on file  Tobacco Use   Smoking status: Former    Pack years: 0.00   Smokeless tobacco:  Never  Vaping Use   Vaping Use: Never used  Substance and Sexual Activity   Alcohol use: Yes    Comment: 1 drink/day   Drug use: Never   Sexual activity: Yes  Other Topics Concern   Not on file  Social History Narrative   ** Merged History Encounter **       Social Determinants of Health   Financial Resource Strain: Not on file  Food Insecurity: Not on file  Transportation Needs: Not on file  Physical Activity: Not on file  Stress: Not on file  Social Connections: Not on file  Intimate Partner Violence: Not on file    Family History  Problem Relation Age of Onset   Diabetes Father    Cancer Maternal Grandmother        brest    Breast cancer Maternal  Grandmother        not sure of age    Ht 5\' 6"  (1.676 m)   Wt 187 lb (84.8 kg)   BMI 30.18 kg/m   [facility] 12/15/2019 01/12/2020 01/26/2020  Frequency of aerobic exercise (# of days/week) 3 3 3   Average time in minutes 20 20 45  Frequency of strengthening activities (# of days/week) 0 0 1    No flowsheet data found.  Review of Systems: See HPI above.     Objective:  Physical Exam:  Gen: NAD, comfortable in exam room  Right knee: Mild effusion.  No other gross deformity, bruising or warmth. Tender palpation medial joint line.  No other tenderness. Lacks about 5 degrees of extension and can only flex to about 90 degrees with quite a bit of pain. Strength 5 out of 5 with flexion and extension. Negative anterior posterior drawer, negative Lachman's, negative varus and valgus stress. Positive McMurray and Thessaly's. Neurovascular intact distally.   Assessment & Plan:  1.  Right knee pain: Patient's location of pain, exam, decreased ability to fully flex or extend the knee concerning for either a bucket-handle or flap type medial meniscus tear.  We will go ahead with an MRI to further assess.  Icing, knee brace or sleeve, simple motion exercises.  Offered crutches but she declined at this time.  Diclofenac 75 mg twice a day with food.  We will call her with the results and neck steps depending on the imaging.  Of note she has had radiographs that showed moderate arthritis especially the patellofemoral compartment but no acute fractures.

## 2020-08-14 ENCOUNTER — Ambulatory Visit
Admission: RE | Admit: 2020-08-14 | Discharge: 2020-08-14 | Disposition: A | Discharge status: Home / Self Care | Payer: 59 | Source: Ambulatory Visit | Attending: Family Medicine | Admitting: Family Medicine

## 2020-08-14 DIAGNOSIS — M25561 Pain in right knee: Secondary | ICD-10-CM

## 2020-08-19 NOTE — Progress Notes (Signed)
Referral to Dr. Griffin Basil for right knee - advanced arthritis especially of patellofemoral compartment and several loose bodies in her knee.    [place] [address] [place]  Appt: 08/24/20 @ 1:45 pm.

## 2021-06-17 ENCOUNTER — Other Ambulatory Visit: Payer: Self-pay | Admitting: Internal Medicine

## 2021-06-17 DIAGNOSIS — Z1231 Encounter for screening mammogram for malignant neoplasm of breast: Secondary | ICD-10-CM

## 2021-07-12 ENCOUNTER — Ambulatory Visit
Admission: RE | Admit: 2021-07-12 | Discharge: 2021-07-12 | Disposition: A | Discharge status: Home / Self Care | Payer: 59 | Source: Ambulatory Visit | Attending: Internal Medicine | Admitting: Internal Medicine

## 2021-07-12 DIAGNOSIS — Z1231 Encounter for screening mammogram for malignant neoplasm of breast: Secondary | ICD-10-CM

## 2021-08-23 ENCOUNTER — Encounter: Payer: Self-pay | Admitting: Physician Assistant

## 2021-08-23 ENCOUNTER — Ambulatory Visit: Payer: 59 | Admitting: Physician Assistant

## 2021-08-23 DIAGNOSIS — B360 Pityriasis versicolor: Secondary | ICD-10-CM | POA: Diagnosis not present

## 2021-08-23 MED ORDER — KETOCONAZOLE 2 % EX SHAM: 1.0000 | MEDICATED_SHAMPOO | Freq: Every day | CUTANEOUS | 6 refills | Status: AC

## 2021-08-23 MED ORDER — FLUCONAZOLE 200 MG PO TABS: 200.0000 mg | ORAL_TABLET | Freq: Every day | ORAL | 0 refills | Status: AC

## 2021-09-14 ENCOUNTER — Encounter: Payer: Self-pay | Admitting: Physician Assistant

## 2021-09-14 NOTE — Progress Notes (Signed)
   Follow-Up Visit   Subjective  Ana Carter is a 56 y.o. female who presents for the following: Tinea (Patient wants refills of ketoconazole and fluconazole she sated this comes back every summer).   The following portions of the chart were reviewed this encounter and updated as appropriate:  Tobacco  Allergies  Meds  Problems  Med Hx  Surg Hx  Fam Hx      Objective  Well appearing patient in no apparent distress; mood and affect are within normal limits.  A full examination was performed including scalp, head, eyes, ears, nose, lips, neck, chest, axillae, abdomen, back, buttocks, bilateral upper extremities, bilateral lower extremities, hands, feet, fingers, toes, fingernails, and toenails. All findings within normal limits unless otherwise noted below.  Head - Anterior (Face), Scalp Oval or circular patches and plaques with an associated fine scale.     Assessment & Plan  Tinea versicolor Head - Anterior (Face); Scalp  ketoconazole (NIZORAL) 2 % shampoo - Head - Anterior (Face), Scalp Apply 1 Application topically daily.  fluconazole (DIFLUCAN) 200 MG tablet - Head - Anterior (Face), Scalp Take 1 tablet (200 mg total) by mouth daily.    I, Curran Lenderman, PA-C, have reviewed all documentation's for this visit.  The documentation on 09/14/21 for the exam, diagnosis, procedures and orders are all accurate and complete.

## 2021-12-03 ENCOUNTER — Other Ambulatory Visit: Payer: Self-pay

## 2021-12-03 ENCOUNTER — Encounter (HOSPITAL_BASED_OUTPATIENT_CLINIC_OR_DEPARTMENT_OTHER): Payer: Self-pay | Admitting: Emergency Medicine

## 2021-12-03 DIAGNOSIS — M791 Myalgia, unspecified site: Secondary | ICD-10-CM | POA: Diagnosis present

## 2021-12-03 DIAGNOSIS — Z28310 Unvaccinated for covid-19: Secondary | ICD-10-CM | POA: Diagnosis not present

## 2021-12-03 DIAGNOSIS — U071 COVID-19: Secondary | ICD-10-CM | POA: Insufficient documentation

## 2021-12-03 MED ORDER — ONDANSETRON 4 MG PO TBDP
4.0000 mg | ORAL_TABLET | Freq: Once | ORAL | Status: AC | PRN
  Administered 2021-12-04: 4 mg via ORAL
  Filled 2021-12-03: qty 1

## 2021-12-03 NOTE — ED Triage Notes (Signed)
Bib ems for covid like symptom, started yesterday. C/o body aches, Fever, nausea decrease appetite    Took mucinex at home  Ems reports hearing some rhonchi  Ems VS 144/73 hr 96 97% on RA temp 99.6

## 2021-12-04 ENCOUNTER — Emergency Department (HOSPITAL_BASED_OUTPATIENT_CLINIC_OR_DEPARTMENT_OTHER)
Admission: EM | Admit: 2021-12-04 | Discharge: 2021-12-04 | Disposition: A | Discharge status: Home / Self Care | Payer: 59 | Attending: Emergency Medicine | Admitting: Emergency Medicine

## 2021-12-04 DIAGNOSIS — U071 COVID-19: Secondary | ICD-10-CM

## 2021-12-04 LAB — RESP PANEL BY RT-PCR (FLU A&B, COVID) ARPGX2
Influenza A by PCR: NEGATIVE
Influenza B by PCR: NEGATIVE
SARS Coronavirus 2 by RT PCR: POSITIVE — AB

## 2021-12-04 MED ORDER — NIRMATRELVIR/RITONAVIR (PAXLOVID)TABLET: 3.0000 | ORAL_TABLET | Freq: Two times a day (BID) | ORAL | 0 refills | Status: AC

## 2021-12-04 MED ORDER — SODIUM CHLORIDE 0.9 % IV BOLUS
1000.0000 mL | Freq: Once | INTRAVENOUS | Status: AC
  Administered 2021-12-04: 1000 mL via INTRAVENOUS

## 2021-12-04 MED ORDER — ACETAMINOPHEN 500 MG PO TABS
1000.0000 mg | ORAL_TABLET | Freq: Once | ORAL | Status: AC
  Administered 2021-12-04: 1000 mg via ORAL
  Filled 2021-12-04: qty 2

## 2021-12-04 NOTE — Discharge Instructions (Signed)
You need to quarantine for 10 days given your nonvaccinated status.  Your BMI does put you eligible for Paxlovid.  Prescription was provided.  Follow-up closely to primary doctor.  Make sure that you are staying hydrated.  Take Tylenol or ibuprofen as needed for pain and body aches.

## 2021-12-04 NOTE — ED Provider Notes (Signed)
[facility] EMERGENC[facility] DEPT Provider Note   CSN: [id number] Arrival date & time: 12/03/21  2339     History  No chief complaint on file.   Ana Carter is a 56 y.o. female.  HPI     This is a 56 year old female who presents with a 1 day history of body aches, fever, nausea, cough, sore throat.  Patient states she has not ever felt this bad.  She has had chills at home but has not had any documented fevers.  She took Mucinex without relief.  She has not had any known sick contacts.  She is not vaccinated against COVID-19.  Denies chest pain or shortness of breath.  Home Medications Prior to Admission medications   Medication Sig Start Date End Date Taking? Authorizing Provider  atorvastatin (LIPITOR) 10 MG tablet Take 10 mg by mouth daily. 12/03/18   [provider]  buPROPion (WELLBUTRIN XL) 150 MG 24 hr tablet Take 450 mg by mouth daily.    [provider]  citalopram (CELEXA) 40 MG tablet  05/01/17   [provider]  dexlansoprazole (DEXILANT) 60 MG capsule Take 60 mg by mouth daily.    [provider]  diclofenac (VOLTAREN) 75 MG EC tablet Take 1 tablet (75 mg total) by mouth 2 (two) times daily. 08/11/20   Hudnall, Sharyn Lull, MD  estrogens-methylTEST (ESTRATEST) 1.25-2.5 MG tablet Take 1 tablet by mouth daily.    [provider]  fluconazole (DIFLUCAN) 200 MG tablet Take 1 tablet (200 mg total) by mouth daily. 08/23/21   Sheffield, Ronalee Red, PA-C  ketoconazole (NIZORAL) 2 % shampoo Apply 1 Application topically daily. 08/23/21   Sheffield, Ronalee Red, PA-C  lamoTRIgine (LAMICTAL) 150 MG tablet Take 150 mg by mouth 2 (two) times daily. 07/23/19   [provider]      Allergies    Codeine, Codeine, Sulfa antibiotics, and Sulfa antibiotics    Review of Systems   Review of Systems  Constitutional:  Positive for chills and fever.  HENT:  Positive for congestion and sore throat.   Respiratory:  Positive for cough.    All other systems reviewed and are negative.   Physical Exam Updated Vital Signs BP 136/70   Pulse 88   Temp 100.3 F (37.9 C) (Oral)   Resp 18   Ht 1.676 m ('5\' 6"'$ )   Wt 81.6 kg   SpO2 94%   BMI 29.05 kg/m  Physical Exam Vitals and nursing note reviewed.  Constitutional:      Appearance: She is well-developed. She is not ill-appearing.  HENT:     Head: Normocephalic and atraumatic.     Mouth/Throat:     Mouth: Mucous membranes are moist.  Eyes:     Extraocular Movements: Extraocular movements intact.     Pupils: Pupils are equal, round, and reactive to light.  Cardiovascular:     Rate and Rhythm: Normal rate and regular rhythm.     Heart sounds: Normal heart sounds.  Pulmonary:     Effort: Pulmonary effort is normal. No respiratory distress.     Breath sounds: No wheezing.  Abdominal:     Palpations: Abdomen is soft.     Tenderness: There is no abdominal tenderness.  Musculoskeletal:     Cervical back: Neck supple.  Skin:    General: Skin is warm and dry.  Neurological:     Mental Status: She is alert and oriented to person, place, and time.  Psychiatric:  Mood and Affect: Mood normal.     ED Results / Procedures / Treatments   Labs (all labs ordered are listed, but only abnormal results are displayed) Labs Reviewed  RESP PANEL BY RT-PCR (FLU A&B, COVID) ARPGX2 - Abnormal; Notable for the following components:      Result Value   SARS Coronavirus 2 by RT PCR POSITIVE (*)    All other components within normal limits    EKG None  Radiology No results found.  Procedures Procedures    Medications Ordered in ED Medications  ondansetron (ZOFRAN-ODT) disintegrating tablet 4 mg (4 mg Oral Given 12/04/21 0002)  sodium chloride 0.9 % bolus 1,000 mL (1,000 mLs Intravenous New Bag/Given 12/04/21 0132)  acetaminophen (TYLENOL) tablet 1,000 mg (1,000 mg Oral Given 12/04/21 0130)    ED Course/ Medical Decision Making/ A&P                            Medical Decision Making Risk OTC drugs. Prescription drug management.   This patient presents to the ED for concern of upper respiratory symptoms, this involves an extensive number of treatment options, and is a complaint that carries with it a high risk of complications and morbidity.  I considered the following differential and admission for this acute, potentially life threatening condition.  The differential diagnosis includes oral illness such as COVID-19 or influenza, pneumonia  MDM:    This is a 56 year old female who presents with upper respiratory symptoms and chills.  She is nontoxic.  Vital signs notable for temperature 100.3.  She appears well-hydrated although she is mildly tachycardic at 108.  Patient was given a liter of fluids.  COVID and influenza testing were sent.  COVID testing is positive.  Breath sounds are clear.  Do not feel that x-ray imaging is indicated at this time as I have low suspicion for bacterial pneumonia.  Patient is technically overweight and would qualify for Paxlovid.  Discussed this with the patient.  She was provided with a prescription.  Outside records reviewed which showed a GFR of 57 and >60 respectively.  Given that she is otherwise well-appearing, will not repeat imaging at this time.  She was given a gram of Tylenol.  She is not vaccinated.  We discussed quarantine precautions.  (Labs, imaging, consults)  Labs: I Ordered, and personally interpreted labs.  The pertinent results include: COVID and influenza  Imaging Studies ordered: I ordered imaging studies including none I independently visualized and interpreted imaging. I agree with the radiologist interpretation  Additional history obtained from chart review review.  External records from outside source obtained and reviewed including prior labs and renal function  Cardiac Monitoring: The patient was maintained on a cardiac monitor.  I personally viewed and interpreted the cardiac  monitored which showed an underlying rhythm of: Sinus rhythm  Reevaluation: After the interventions noted above, I reevaluated the patient and found that they have :stayed the same  Social Determinants of Health: Lives independently  Disposition: Discharge  Co morbidities that complicate the patient evaluation  Past Medical History:  Diagnosis Date   Acute meniscal tear of left knee    Anxiety    Arthritis    knees and lower back   Atypical mole 02/20/2013   Right Upper Thigh (moderate)   Depression    GERD (gastroesophageal reflux disease)      Medicines Meds ordered this encounter  Medications   ondansetron (ZOFRAN-ODT) disintegrating tablet 4 mg  sodium chloride 0.9 % bolus 1,000 mL   acetaminophen (TYLENOL) tablet 1,000 mg   nirmatrelvir/ritonavir EUA (PAXLOVID) 20 x 150 MG & 10 x '100MG'$  TABS    Sig: Take 3 tablets by mouth 2 (two) times daily for 5 days. Patient GFR is >60.  Take nirmatrelvir (150 mg) two tablets twice daily for 5 days and ritonavir (100 mg) one tablet twice daily for 5 days.    Dispense:  30 tablet    Refill:  0    I have reviewed the patients home medicines and have made adjustments as needed  Problem List / ED Course: Problem List Items Addressed This Visit   None Visit Diagnoses     COVID-19    -  Primary   Relevant Medications   nirmatrelvir/ritonavir EUA (PAXLOVID) 20 x 150 MG & 10 x '100MG'$  TABS                   Final Clinical Impression(s) / ED Diagnoses Final diagnoses:  None    Rx / DC Orders ED Discharge Orders     None         Jannett Schmall, Barbette Hair, MD 12/04/21 [phone]

## 2021-12-04 NOTE — ED Notes (Signed)
Taxi called at this time for patient.

## 2022-06-29 IMAGING — MG MM DIGITAL SCREENING BILAT W/ TOMO AND CAD
8 series · 9 of 24 positions shown · non-contrast
Comparison: Previous exam(s).

ACR Breast Density Category a: The breast tissue is almost entirely
fatty.

CLINICAL DATA: Screening.

EXAM:
DIGITAL SCREENING BILATERAL MAMMOGRAM WITH TOMOSYNTHESIS AND CAD
TECHNIQUE: Bilateral screening digital craniocaudal and mediolateral oblique
mammograms were obtained. Bilateral screening digital breast
tomosynthesis was performed. The images were evaluated with
computer-aided detection.

[R CC synth-2D]
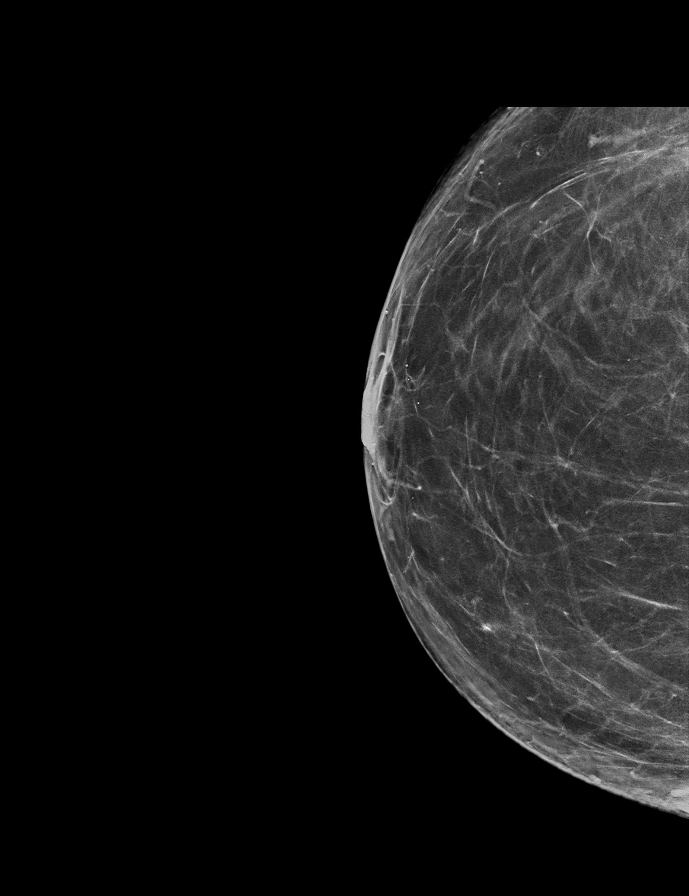

[R MLO synth-2D]
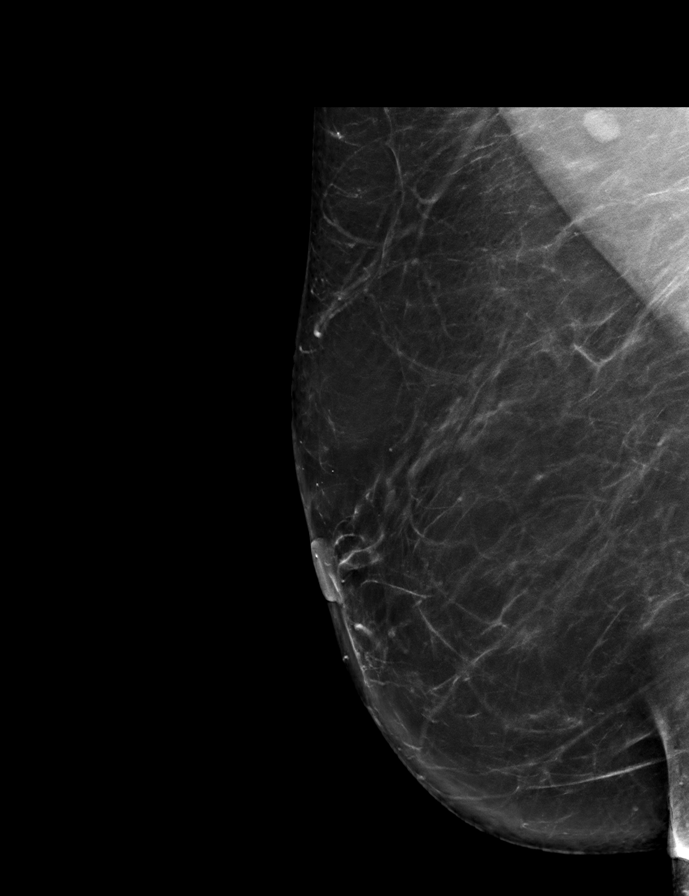

[L MLO synth-2D]
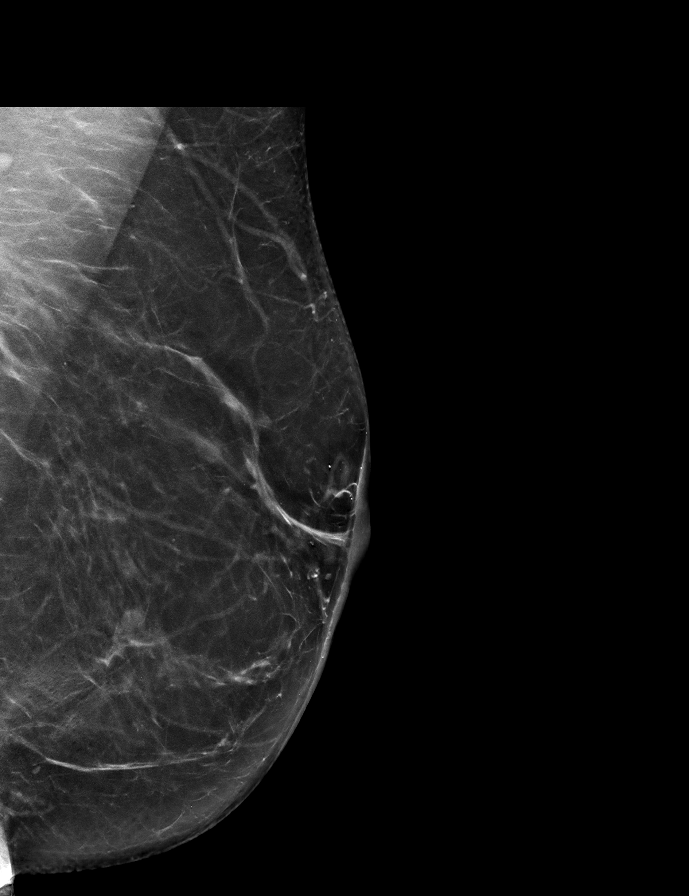

[L CC synth-2D]
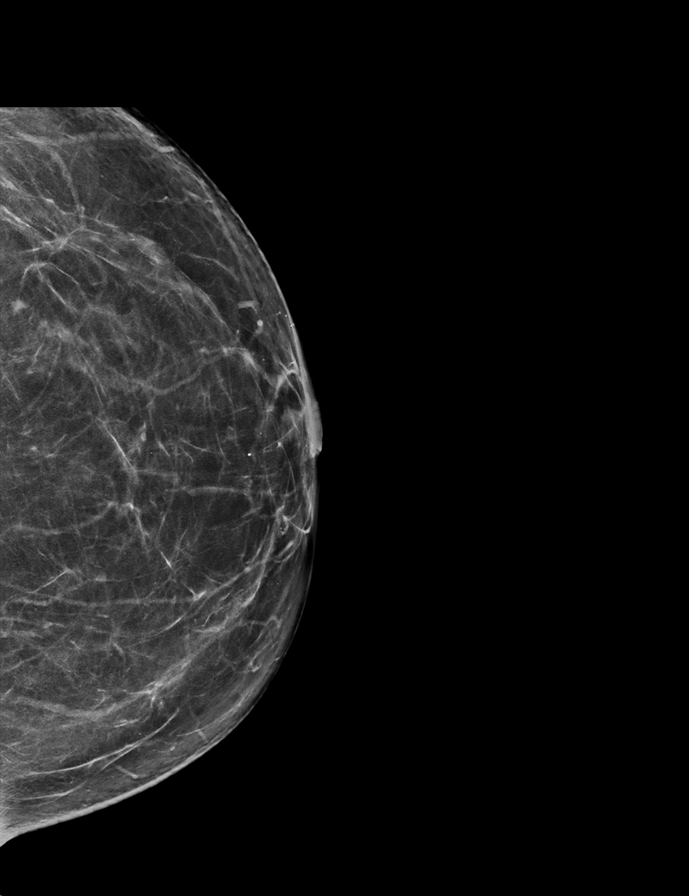

[L CC tomo · 2 of 72 frames shown]
[frame 24/72]
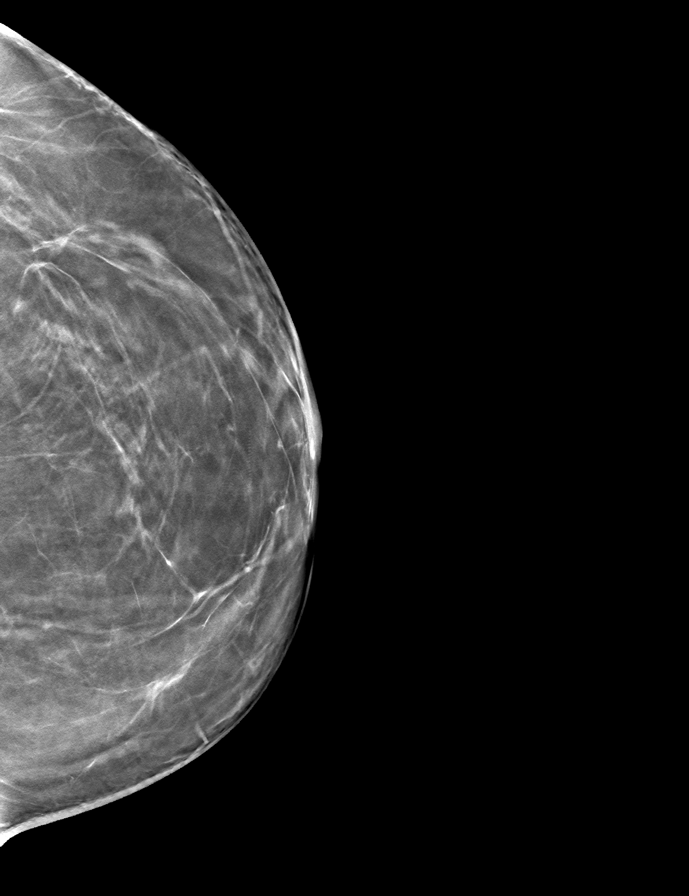
[frame 37/72]
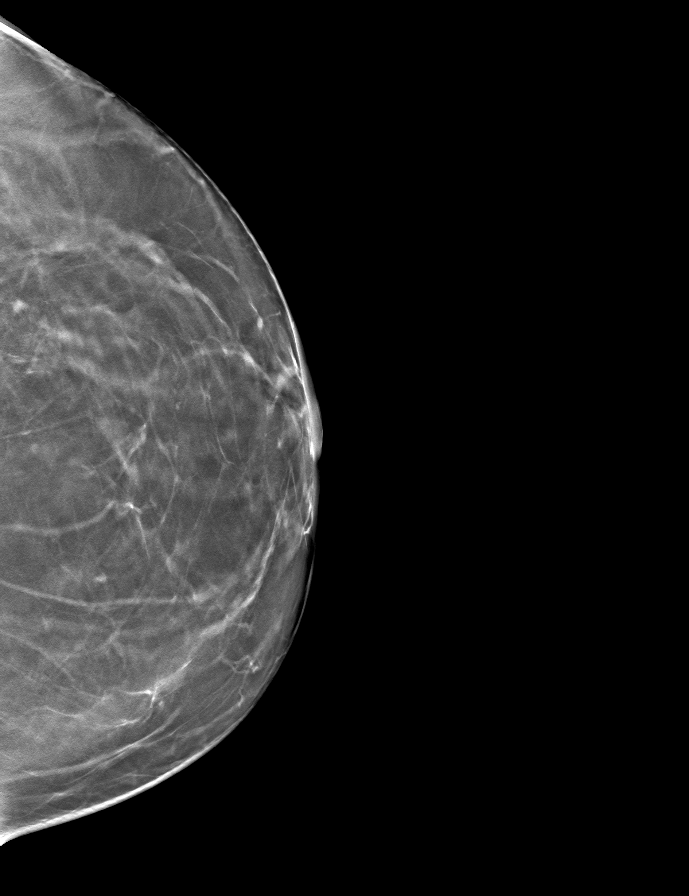

[L MLO tomo · tomo slice 39/77.0]
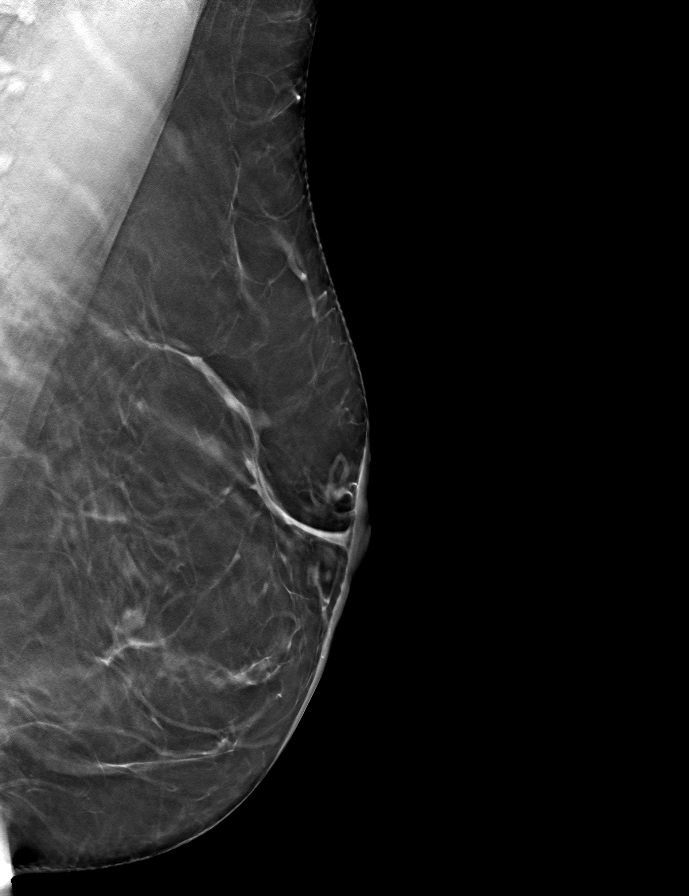

[R CC tomo · tomo slice 38/75.0]
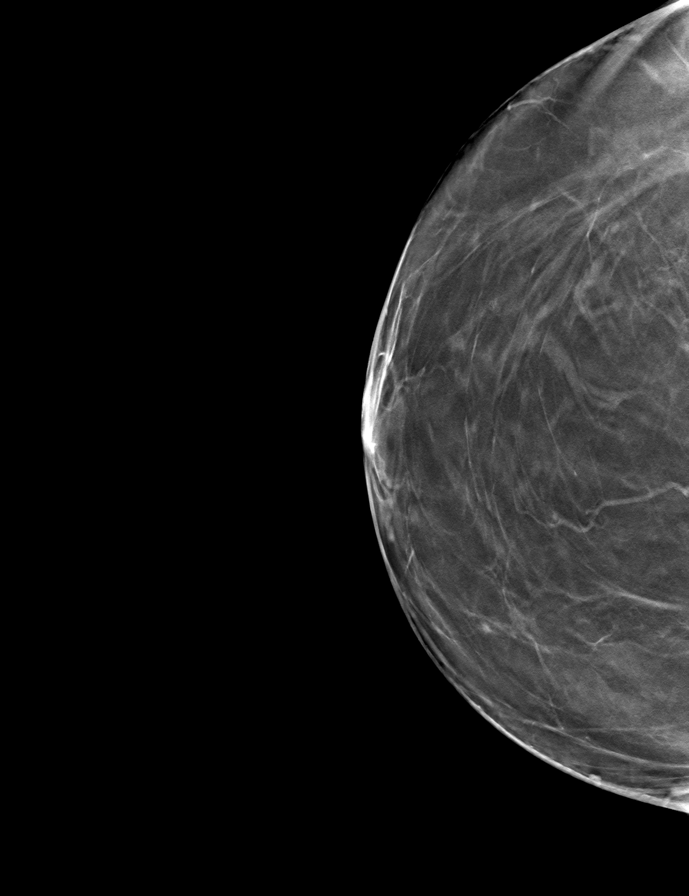

[R MLO tomo · tomo slice 41/82.0]
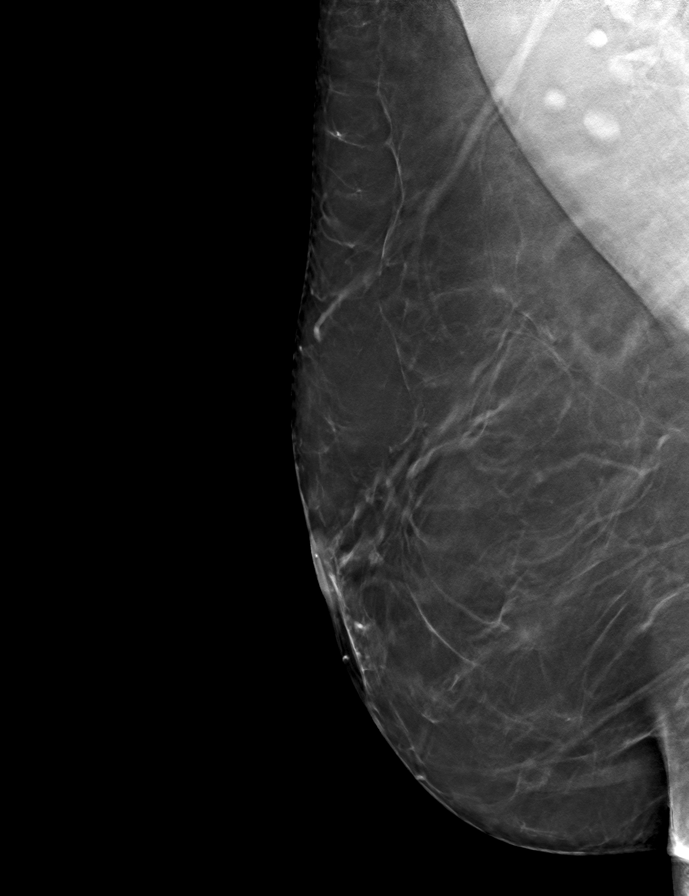

[9 of 24 positions shown; findings below may reference images not displayed]

FINDINGS: There are no findings suspicious for malignancy.
IMPRESSION: No mammographic evidence of malignancy. A result letter of this
screening mammogram will be mailed directly to the patient.

RECOMMENDATION:
Screening mammogram in one year. (Code:0E-3-N98)

BI-RADS CATEGORY  1: Negative.
